# Patient Record
Sex: Male | Born: 1945
Health system: Southern US, Community
[De-identification: ages and names within clinical notes are randomized; demographics above are authoritative.]

## PROBLEM LIST (undated history)

## (undated) DIAGNOSIS — I723 Aneurysm of iliac artery: Secondary | ICD-10-CM

## (undated) DIAGNOSIS — M1990 Unspecified osteoarthritis, unspecified site: Secondary | ICD-10-CM

## (undated) DIAGNOSIS — I10 Essential (primary) hypertension: Secondary | ICD-10-CM

## (undated) DIAGNOSIS — I739 Peripheral vascular disease, unspecified: Secondary | ICD-10-CM

## (undated) HISTORY — PX: ANGIOPLASTY: SHX39

## (undated) HISTORY — DX: Aneurysm of iliac artery: I72.3

## (undated) HISTORY — DX: Unspecified osteoarthritis, unspecified site: M19.90

## (undated) HISTORY — DX: Essential (primary) hypertension: I10

## (undated) NOTE — *Deleted (*Deleted)
Patient denies shortness of breath, fever, cough or chest pain.  PCP - Dr Chanetta Marshall Cardiologist - *** Endocrine - Dr Talmage Nap  Chest x-ray - 07/22/20 EKG - 07/22/20 Stress Test - n/a ECHO - n/a Cardiac Cath - n/a  Fasting Blood Sugar - *** Checks Blood Sugar *** times a day  ERAS: Clears til 9:30 am DOS, G2 drink given.  Anesthesia review: ***  Coronavirus Screening Covid test scheduled on 07/25/20 Do you have any of the following symptoms:  Cough yes/no: No Fever (>100.58F)  yes/no: No Runny nose yes/no: No Sore throat yes/no: No Difficulty breathing/shortness of breath  yes/no: No  Have you traveled in the last 14 days and where? yes/no: No  Patient verbalized understanding of instructions that were given to them at the PAT appointment.

---

## 2001-01-03 ENCOUNTER — Ambulatory Visit (HOSPITAL_COMMUNITY): Admission: RE | Admit: 2001-01-03 | Discharge: 2001-01-03 | Payer: Self-pay | Admitting: Family Medicine

## 2001-02-21 ENCOUNTER — Encounter: Payer: Self-pay | Admitting: Vascular Surgery

## 2001-02-23 ENCOUNTER — Ambulatory Visit (HOSPITAL_COMMUNITY): Admission: RE | Admit: 2001-02-23 | Discharge: 2001-02-23 | Payer: Self-pay | Admitting: Vascular Surgery

## 2001-02-23 DIAGNOSIS — I723 Aneurysm of iliac artery: Secondary | ICD-10-CM

## 2001-02-23 HISTORY — DX: Aneurysm of iliac artery: I72.3

## 2004-03-06 ENCOUNTER — Ambulatory Visit (HOSPITAL_COMMUNITY): Admission: RE | Admit: 2004-03-06 | Discharge: 2004-03-06 | Payer: Self-pay | Admitting: Vascular Surgery

## 2007-02-02 ENCOUNTER — Emergency Department (HOSPITAL_COMMUNITY): Admission: EM | Admit: 2007-02-02 | Discharge: 2007-02-02 | Payer: Self-pay | Admitting: *Deleted

## 2008-02-14 ENCOUNTER — Ambulatory Visit: Payer: Self-pay | Admitting: Vascular Surgery

## 2008-06-28 ENCOUNTER — Ambulatory Visit: Payer: Self-pay | Admitting: Vascular Surgery

## 2009-02-11 ENCOUNTER — Ambulatory Visit: Payer: Self-pay | Admitting: Vascular Surgery

## 2009-07-02 ENCOUNTER — Ambulatory Visit: Payer: Self-pay | Admitting: Vascular Surgery

## 2010-07-22 ENCOUNTER — Ambulatory Visit: Payer: Self-pay | Admitting: Vascular Surgery

## 2011-02-17 NOTE — Consult Note (Signed)
NEW PATIENT CONSULTATION   Peter Walker, DAUGHTRIDGE  DOB:  04/30/1946                                       02/11/2009  BJYNW#:[id number]   The patient is a patient we had previously seen for left lower extremity  arterial insufficiency.  He had PTA and stenting by me in 2002 and again  in 2005 with good results.  Three weeks ago he developed some mild edema  in both lower extremities and is concerned about this.  He has been  watching this wax and wane.  He states that it is much better now and he  is not having significant swelling.  He has no history of deep vein  thrombosis, thrombophlebitis, pulmonary embolism, varicose veins, or  other venous problems.  Denies any claudication symptoms in the hip at  this time.  States he is able to ambulate long distances.  He has  stopped smoking 5 years ago and is currently taking aspirin 81 mg per  day.  He has not worn elastic compression stockings, has not seen the  need to do this in the past.   PAST MEDICAL HISTORY:  Positive for diabetes and hypertension.  Negative  for coronary artery disease, myocardial infarction, angina pectoris,  CVA, TIA, amaurosis fugax.   PHYSICAL EXAM:  Blood pressure today is 157/79, heart rate is 59,  respirations 18.  Generally, he is a healthy-appearing middle-aged male  in no apparent distress, alert and oriented x3.  Neck is supple, 3+  carotid pulses palpable.  No bruits are audible.  Chest is clear to  auscultation.  Cardiovascular exam is a regular rhythm with no murmurs.  Abdomen is soft and nontender with no masses.  He has 3+ femoral and  popliteal pulses bilaterally with well-perfused lower extremities.  There is minimal edema in the lower extremities today with no evidence  of any varicosities, hyperpigmentation, ulceration, or other venous  insufficiency stigmata.   Venous duplex exam reveals no evidence of deep venous obstruction in  either lower extremity with no valvular  incompetence in the superficial  saphenous systems.  Arterial flow is triphasic.  He was reassured  regarding these findings.  I think any edema would be due to fluid  overload or sodium imbalance, and he will check with his medical doctor,  Dr. Dorothe Pea, if he has any further problems.   Quita Skye Hart [place], M.D.  Electronically Signed   JDL/MEDQ  D:  02/11/2009  T:  02/11/2009  Job:  [id number]   cc:   Jethro Bastos, M.D.

## 2011-02-17 NOTE — Procedures (Signed)
DUPLEX DEEP VENOUS EXAM - LOWER EXTREMITY   INDICATION:  Bilateral ankle swelling.   HISTORY:  Edema:  Ankle swelling.  Trauma/Surgery:  Left CIA PTA/stent.  Pain:  No.  PE:  No.  Previous DVT:  No.  Anticoagulants:  Aspirin.  Other:   DUPLEX EXAM:                CFV   SFV   PopV  PTV    GSV                R  L  R  L  R  L  R   L  R  L  Thrombosis    o  o  o  o  o  o  o   o  o  o  Spontaneous   +  +  +  +  +  +  +   +  +  +  Phasic        +  +  +  +  +  +  +   +  +  +  Augmentation  +  +  +  +  +  +  +   +  +  +  Compressible  +  +  +  +  +  +  +   +  +  +  Competent     +  +  +  +  +  +  +   +  +  +   Legend:  + - yes  o - no  p - partial  D - decreased   IMPRESSION:  1. No evidence of DVT or SVT in bilateral lower extremity venous      system.  2. No evidence of venous insufficiency bilaterally.  3. Incidental note:  Bilateral posterior tibial artery waveforms      appear within normal limits.    _____________________________  Quita Skye Hart [place], M.D.   AS/MEDQ  D:  02/11/2009  T:  02/11/2009  Job:  [id number]

## 2011-02-17 NOTE — Assessment & Plan Note (Signed)
OFFICE VISIT   ARSHAN, JABS  DOB:  [birth year]                                       07/22/2010  DVVOH#:[id number]   Patient returns today for continued follow-up regarding his lower  extremity occlusive disease.  He was treated with PTA and stenting of  his left iliac artery in 2002 and again in 2005.  He has no classic  claudication symptoms at this time but has some sciatica in the right  hip area which is being evaluated by Dr. Richardson Landry.  He had an MRI  performed 3 weeks ago, results of which are unknown at this time.  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  He denies any rest pain or history of  nonhealing ulcers.   CHRONIC MEDICAL PROBLEMS:  1. Diabetes.  2. Hypertension.  3. Degenerative joint disease.   SOCIAL HISTORY:  He is married, has 3 children.  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  Does not use tobacco or alcohol.   REVIEW OF SYSTEMS:  Positive for arthritis, joint pain, muscle pain.  He  denies any chest pain, dyspnea on exertion, PND, orthopnea, chronic  bronchitis.  All other systems negative in review of systems.   PHYSICAL EXAMINATION:  Blood pressure 153/80, heart rate 71,  respirations 14.  General:  He is a well-developed and well-nourished  male in no apparent distress, alert and oriented x3.  HEENT:  Normal for  age.  EOMs intact.  Lungs:  Clear to auscultation.  No rhonchi or  wheezing.  Cardiovascular:  Regular rhythm.  No murmurs.  Carotid pulses  are 3+.  No bruits.  Abdomen:  Soft, nontender with no masses.  Musculoskeletal:  Free of major deformities.  Neurologic:  Normal.  Skin:  Free of rashes.  Lower extremity exam reveals 2-3+ femoral,  popliteal, and dorsalis pedis pulses bilaterally with well-perfused  lower extremities.   Today I ordered lower extremity arterial Dopplers which revealed ABI of  0.96 on the right and 1.22 on the left,  biphasic flow.   I have reassured him regarding his stents.  Will continue to follow him  on a regular basis unless his symptoms change.   He will follow up Dr. Eulah Pont for his back problems.     Quita Skye Hart [place], M.D.  Electronically Signed   JDL/MEDQ  D:  07/22/2010  T:  07/23/2010  Job:  [id number]

## 2011-02-20 NOTE — Op Note (Signed)
[facility]  Patient:    Peter Walker, Peter Walker                        MRN: [id number] Proc. Date: 02/23/01 Adm. Date:  [id number] Attending:  Colvin Caroli CC:         Peripheral Endovascular Lab, [facility]                           Operative Report  PREOPERATIVE DIAGNOSIS:  Aortoiliac occlusive disease with severe claudication, left leg.  PROCEDURES: 1. Abdominal aortogram with bilateral lower extremity runoff with the left    common femoral approach. 2. Percutaneous transluminal angioplasty and stenting of left common iliac    stenosis using 7 x 3 Powerflex catheter and a Palmaz P294 stent, plus    angioplasty using a Powerflex 8 x 2 PTA catheter.  SURGEON:  Quita Skye. Hart [place], M.D.  ANESTHESIA:  Local Xylocaine, Versed 2 mg intravenously.  DESCRIPTION OF PROCEDURE:  The patient was taken to the [facility] Peripheral Endovascular Lab and placed in the supine position, at which time both groins were prepped with Betadine solution and draped in a routine sterile manner. After infiltration with 1% Xylocaine, the left common femoral artery was entered percutaneously and a guidewire passed into the proximal iliac artery, where it met obstruction at the common iliac level.  A 5 French sheath and dilator were passed over the guidewire, the guidewire exchanged for a glidewire, which then traversed the stenosis and was passed into the proximal aorta.  A standard pigtail catheter was positioned in the suprarenal aorta.  A flush abdominal aortogram was performed, injecting 20 cc of contrast at 20 cc per second.  This revealed the aorta to be widely patent proximally with single renal arteries bilaterally, which were widely patent, and a patent inferior mesenteric artery.  There was severe atherosclerosis in the terminal aorta beginning distal to the inferior mesenteric with some mild filling defects secondary to atherosclerosis in the aorta.  The  right common iliac artery was relatively smooth and widely patent, as was the right internal and external iliac artery.  The left common iliac artery had a plaque throughout, causing moderate narrowing, with a severe stenosis at the very end of the common iliac artery approximating 95%, with also a severe stenosis at the origin of the internal iliac artery.  The catheter was withdrawn into the terminal aorta and a second injection performed with examination of the right lower extremity runoff.  The catheter was almost totally obstructive in the left common iliac artery because of the severity of the stenosis.  The right common, internal, and external iliac arteries were widely patent, and the common femoral artery, profunda femoris artery, and superficial femoral artery were widely patent with some very mild atherosclerotic changes in the superficial femoral artery proximally.  The popliteal artery was patent across the knee joint, and there was three-vessel runoff with the posterior tibial appearing to be the best vessel, with some disease in the anterior tibial proximally, with patency of the peroneal artery.  Following this, iliac angiography was performed and the lesion in the common iliac artery better delineated using the RAO projection.  The patient was given 3000 units of heparin intravenously, and a 6 French sheath was exchanged over the guidewire and positioned appropriately.  A Palmaz P294 stent was positioned over a 7 x 3 Powerflex angioplasty catheter and  this positioned over the lesion in the distal common iliac artery, and dilatation performed at 9 atmospheres for 45 seconds.  The catheter was carefully removed and angiogram performed through the sheath, which revealed significant improvement of the stenosis with slight undersizing.  Therefore, an 8 x 2 mm Powerflex catheter was positioned within the stent and a second inflation at 8 atmospheres for 30 seconds was performed,  and a completion angiogram revealed a very mild flaring of the distal end of the stent, and therefore an 8 x 2 PTA catheter was positioned across the distal end of the stent and a third inflation performed at 7 atmospheres for 30 seconds.  A final injection revealed this to be very smooth at the angioplasty site with some tapering of the proximal common iliac artery into the diseased aorta.  Left lower extremity angiogram was then performed through the sheath, which revealed the superficial femoral, common femoral, profunda femoris, popliteal arteries on the left to be widely patent with three-vessel runoff.  Having tolerated the procedure well, the sheath was removed after the heparin had been reversed, adequate compression applied.  No complications ensued.  FINDINGS: 1. Severe distal aortoiliac occlusive disease with 95% distal common iliac    stenosis on the left and widely patent right iliac system. 2. Single widely patent renal arteries bilaterally. 3. Patent inferior mesenteric artery and patent internal iliac artery on the    right. 4. Severe stenosis at the origin of the left internal iliac artery. 5. Patent superficial femoral and popliteal arteries bilaterally with two-    vessel runoff on the right via posterior tibial and peroneal artery, and    three-vessel runoff on the left.  PROCEDURE PERFORMED:  Percutaneous transluminal angioplasty and stenting of left common iliac stenosis using a 7 x 3 Powerflex and an 8 x 2 Powerflex angioplasty catheter and a P294 stent. DD:  02/23/01 TD:  02/23/01 Job: [id number]

## 2011-02-20 NOTE — Op Note (Signed)
NAME:  Peter Walker, Peter Walker                           ACCOUNT NO.:  [id number]   MEDICAL RECORD NO.:  [id number]                   PATIENT TYPE:  OIB   LOCATION:  2899                                 FACILITY:  [facility]   PHYSICIAN:  Quita Skye. Hart [place], M.D.               DATE OF BIRTH:  1946-06-19   DATE OF PROCEDURE:  03/06/2004  DATE OF DISCHARGE:                                 OPERATIVE REPORT   PREOPERATIVE DIAGNOSIS:  Severe claudication left leg secondary to iliac  occlusive disease.   POSTOPERATIVE DIAGNOSIS:  Severe claudication left leg secondary to iliac  occlusive disease.   PROCEDURE:  1. Abdominal aortogram via a left common femoral approach.  2. PTA and stenting of left common proximal common iliac artery stenosis     using an 8 mm x 18 mm Genesis on Opta at 10 atmospheres for 45 seconds.  3. PTA of left distal iliac artery, (In-stent), stenosis with 8 x 3 TTA     catheter at 10 atmospheres for 45 seconds.  4. Second PTA of left distal common iliac in-stent stenosis with 9 x 2     Powerflex catheter at 12 atmospheres for 30 seconds with completion     angiogram.   SURGEON:  Quita Skye. Hart [place], M.D.   ANESTHESIA:  Local Xylocaine and Versed 2 mg intravenously, heparin 3000  units.   CONTRAST:  135 cc.   COMPLICATIONS:  None.   DESCRIPTION OF PROCEDURE:  The patient was taken to the [facility]  Peripheral Endovascular Laboratory, placed in the supine position at which  time, both groins were prepped with Betadine scrub and solution and draped  in a routine sterile manner.  After infiltration with 1% Xylocaine, the left  common femoral artery was entered percutaneously.  A guide wire was passed  into the suprarenal aorta under fluoroscopic guidance.  A 5 French sheath  and dilator were passed over the guide wire, the dilator removed and a  standard pigtail catheter positioned in the suprarenal aorta.  Flush  abdominal aortogram was performed.  The aorta was widely patent  proximally  with single renal arteries bilaterally which were widely patent.  The  inferior mesenteric artery was also patent.  There were moderate  atherosclerotic changes in the mid to distal aorta with no areas of  significant stenosis but multiple areas of atherosclerotic changes.  The  left common iliac artery had an oblique plaque 1 cm distal to its origin  causing an apparent 75-80% stenosis.  There was an otherwise patent common  iliac artery down to the previously placed stent in the distal left common  iliac artery which had some irregularity within the stent and a significant  60-75% stenosis in the distal portion of the stent.  The left internal iliac  artery was occluded with widely patent left external iliac artery.  The  right common iliac artery had atherosclerotic changes  but had no areas of  significant stenosis.  There was a mild 20% proximal stenosis with post  stenotic dilatation.  The right internal iliac artery had an approximate 70%  proximal stenosis and the right external iliac artery was widely patent.  It  was decided to proceed with PTA and stenting of the proximal left common  iliac stenosis and the in-stent stenosis distally.  Therefore, the 5 sheath  was removed from a bright tip long 7 sheath.  3000 units of heparin was  given intravenously.  Using a Glow-N-Tell marker, the lesions were carefully  isolated.  The proximal left common iliac artery stenosis was dilated using  a Genesis on Opti system with an 8 mm x 18 mm stent on an 8 x 3 balloon and  this was dilated at 10 atmospheres for 45 seconds.  Following this, the same  angioplastic catheter was used to dilate the in-stent stenosis in the left  distal common iliac artery and this was done at 10 atmospheres for 45  seconds, both proximally and distally.  Completion angiogram was then  performed using a pigtail catheter which revealed a widely patent proximal  stent with no stenosis and some mild residual  narrowing in the distal  previously placed stent.  Therefore, a second angioplasty of the in-stent  stenosis was performed using a 9 x 2 mm Powerflex catheter at 12 atmospheres  for 30 seconds and a following angiogram revealed essential elimination of  the in-stent stenosis and widely patent vessels.  Having tolerated the  procedure well, the sheath were removed. Adequate compression applied.  No  complications ensued.   FINDINGS:  1. Proximal left common iliac stenosis approximately 80%.  2. Recurrent in-stent stenosis of left distal common iliac artery     approximating 60-75%.  3. Mild diffuse right common iliac occlusive disease.  4. 70% proximal right internal iliac artery stenosis with occlusion of left     internal iliac artery.  5. Moderate atherosclerotic changes in distal abdominal aorta.  6. Successful PTA and stenting of proximal left common iliac artery stenosis     and distal left common iliac in-stent stenosis.                                               Quita Skye Hart Rochester, M.D.    JDL/MEDQ  D:  03/06/2004  T:  03/06/2004  Job:  [id number]

## 2011-07-20 ENCOUNTER — Ambulatory Visit (INDEPENDENT_AMBULATORY_CARE_PROVIDER_SITE_OTHER): Payer: BC Managed Care – PPO | Admitting: *Deleted

## 2011-07-20 DIAGNOSIS — Z48812 Encounter for surgical aftercare following surgery on the circulatory system: Secondary | ICD-10-CM

## 2011-07-20 DIAGNOSIS — I739 Peripheral vascular disease, unspecified: Secondary | ICD-10-CM

## 2011-07-28 ENCOUNTER — Encounter: Payer: Self-pay | Admitting: Vascular Surgery

## 2011-07-29 ENCOUNTER — Other Ambulatory Visit: Payer: Self-pay

## 2011-07-29 DIAGNOSIS — I739 Peripheral vascular disease, unspecified: Secondary | ICD-10-CM

## 2011-07-29 DIAGNOSIS — Z48812 Encounter for surgical aftercare following surgery on the circulatory system: Secondary | ICD-10-CM

## 2011-10-19 ENCOUNTER — Other Ambulatory Visit: Payer: Self-pay | Admitting: Family Medicine

## 2011-10-19 ENCOUNTER — Ambulatory Visit
Admission: RE | Admit: 2011-10-19 | Discharge: 2011-10-19 | Disposition: A | Discharge status: Home / Self Care | Payer: Medicare Other | Source: Ambulatory Visit | Attending: Family Medicine | Admitting: Family Medicine

## 2011-10-19 DIAGNOSIS — M1990 Unspecified osteoarthritis, unspecified site: Secondary | ICD-10-CM

## 2012-01-14 ENCOUNTER — Encounter: Payer: Self-pay | Admitting: Vascular Surgery

## 2012-01-25 ENCOUNTER — Encounter: Payer: Self-pay | Admitting: Vascular Surgery

## 2012-01-26 ENCOUNTER — Ambulatory Visit (INDEPENDENT_AMBULATORY_CARE_PROVIDER_SITE_OTHER): Payer: Medicare Other | Admitting: Vascular Surgery

## 2012-01-26 ENCOUNTER — Encounter (INDEPENDENT_AMBULATORY_CARE_PROVIDER_SITE_OTHER): Payer: Medicare Other | Admitting: *Deleted

## 2012-01-26 ENCOUNTER — Encounter: Payer: Self-pay | Admitting: Vascular Surgery

## 2012-01-26 VITALS — BP 166/70 | HR 64 | Resp 20 | Ht 71.0 in | Wt 200.0 lb

## 2012-01-26 DIAGNOSIS — I739 Peripheral vascular disease, unspecified: Secondary | ICD-10-CM

## 2012-01-26 DIAGNOSIS — Z48812 Encounter for surgical aftercare following surgery on the circulatory system: Secondary | ICD-10-CM

## 2012-01-26 DIAGNOSIS — I70219 Atherosclerosis of native arteries of extremities with intermittent claudication, unspecified extremity: Secondary | ICD-10-CM | POA: Insufficient documentation

## 2012-01-26 NOTE — Progress Notes (Signed)
Subjective:     Patient ID: Peter Walker, male   DOB: 1946/06/01, 66 y.o.   MRN: [id number]  HPI this 66 year old male has a history of bilateral iliac PTA and stents in 2002 with repeat PTA in 2005. Since that time he has been relatively stable in his symptomatology. He does have a history of a bulging disc which has caused symptoms on the right leg. This has responded to injections in the past and he is cared for by Dr. Richardson Landry. Recently there has been a slight decrease in the ABI in the right leg and he denies any new symptoms of claudication and shorter distance from right buttock and thigh symptoms. He has no rest pain or history of nonhealing ulcers.  Past Medical History  Diagnosis Date  . Diabetes mellitus   . Hypertension   . Degenerative joint disease   . 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 Smoking status: Former Smoker -- 2.0 packs/day for 37 years    Types: Cigarettes    Quit date: 04/15/2004  . Smokeless tobacco: Never Used  . Alcohol Use: No    Family History  Problem Relation Age of Onset  . Diabetes Mother   . Heart disease Mother   . Diabetes Father   . Hyperlipidemia Father   . Hypertension Father   . Heart disease Father     No Known Allergies  Current outpatient prescriptions:amLODipine (NORVASC) 5 MG tablet, Take 5 mg by mouth daily., Disp: , Rfl: ;  atorvastatin (LIPITOR) 20 MG tablet, Take 20 mg by mouth daily., Disp: , Rfl: ;  glucosamine-chondroitin 500-400 MG tablet, Take 1 tablet by mouth 3 (three) times daily., Disp: , Rfl:  insulin aspart protamine-insulin aspart (NOVOLOG MIX 70/30) (70-30) 100 UNIT/ML injection, Inject 35 Units into the skin 2 (two) times daily with a meal., Disp: , Rfl: ;  metFORMIN (GLUCOPHAGE) 500 MG tablet, Take 500 mg by mouth 2 (two) times daily with a meal., Disp: , Rfl: ;  naproxen (NAPROSYN)  500 MG tablet, Take 500 mg by mouth 2 (two) times daily with a meal., Disp: , Rfl:  telmisartan (MICARDIS) 40 MG tablet, Take 40 mg by mouth daily., Disp: , Rfl: ;  aspirin 81 MG tablet, Take 81 mg by mouth daily., Disp: , Rfl:   BP 166/70  Pulse 64  Resp 20  Ht 5\' 11"  (1.803 m)  Wt 200 lb (90.719 kg)  BMI 27.89 kg/m2  Body mass index is 27.89 kg/(m^2).          Review of Systems denies chest pain, dyspnea on exertion, PND, orthopnea. Does have chronic back pain and right lower extremity discomfort due to bulging disc     Objective:   Physical Exam blood pressure 166/70 heart rate 64 respirations 20 General well-developed well-nourished male no apparent stress alert and oriented x3 Chest no rhonchi or wheezing Cardiovascular regular rhythm no murmurs carotid pulses were plus all bruits Abdomen soft nontender no palpable masses Lower extremity 3+ femoral 1-2+ popliteal pulses bilaterally with well-perfused lower extremities  Today I reviewed lower extremity arterial Dopplers which were reordered and interpreted. ABI is 0.80 on the right down from 0.82 in October of 2012 in the left continues to be 1.0. There is triphasic flow and posterior tibial arteries bilaterally.     Assessment:  Suspect mild re\re narrowing within the right iliac stent causing slight decrease in ABI on the right-0.80-asymptomatic    Plan:     We'll continue to follow this on her every 6 month basis. If his symptoms become significantly worse with short walking distances he will be in touch with [place] for repeat angiography

## 2012-07-27 ENCOUNTER — Ambulatory Visit (INDEPENDENT_AMBULATORY_CARE_PROVIDER_SITE_OTHER): Payer: Medicare Other | Admitting: Vascular Surgery

## 2012-07-27 ENCOUNTER — Encounter: Payer: Self-pay | Admitting: Neurosurgery

## 2012-07-27 ENCOUNTER — Ambulatory Visit (INDEPENDENT_AMBULATORY_CARE_PROVIDER_SITE_OTHER): Payer: Medicare Other | Admitting: Neurosurgery

## 2012-07-27 VITALS — BP 148/79 | HR 59 | Resp 16 | Ht 71.0 in | Wt 211.3 lb

## 2012-07-27 DIAGNOSIS — I739 Peripheral vascular disease, unspecified: Secondary | ICD-10-CM

## 2012-07-27 DIAGNOSIS — Z48812 Encounter for surgical aftercare following surgery on the circulatory system: Secondary | ICD-10-CM

## 2012-07-27 NOTE — Progress Notes (Signed)
[facility] Office Note  CC: PD surveillance Referring Physician: Hart Rochester  History of Present Illness: 66 year old male patient of Dr. Hart Rochester who status post left common iliac artery stent 2005. The patient denies claudication, rest pain or any open ulcerations. 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 The patient's only complaint is arthritis in his hands which has been evaluated but not treated.  Past Medical History  Diagnosis Date  . Diabetes mellitus   . Hypertension   . Degenerative joint disease   . Aneurysm, common iliac artery 02/23/2001    Left  and Severe claudication Left leg  secondary to  iliac occlusive  disease 03/06/2004    ROS: [x]  Positive   [ ]  Denies    General: [ ]  Weight loss, [ ]  Fever, [ ]  chills Neurologic: [ ]  Dizziness, [ ]  Blackouts, [ ]  Seizure [ ]  Stroke, [ ]  "Mini stroke", [ ]  Slurred speech, [ ]  Temporary blindness; [ ]  weakness in arms or legs, [ ]  Hoarseness Cardiac: [ ]  Chest pain/pressure, [ ]  Shortness of breath at rest [ ]  Shortness of breath with exertion, [ ]  Atrial fibrillation or irregular heartbeat Vascular: [ ]  Pain in legs with walking, [ ]  Pain in legs at rest, [ ]  Pain in legs at night,  [ ]  Non-healing ulcer, [ ]  Blood clot in vein/DVT,   Pulmonary: [ ]  Home oxygen, [ ]  Productive cough, [ ]  Coughing up blood, [ ]  Asthma,  [ ]  Wheezing Musculoskeletal:  [ ]  Arthritis, [ ]  Low back pain, [ ]  Joint pain Hematologic: [ ]  Easy Bruising, [ ]  Anemia; [ ]  Hepatitis Gastrointestinal: [ ]  Blood in stool, [ ]  Gastroesophageal Reflux/heartburn, [ ]  Trouble swallowing Urinary: [ ]  chronic Kidney disease, [ ]  on HD - [ ]  MWF or [ ]  TTHS, [ ]  Burning with urination, [ ]  Difficulty urinating Skin: [ ]  Rashes, [ ]  Wounds Psychological: [ ]  Anxiety, [ ]  Depression   Social History History  Substance Use Topics  . Smoking status: Former  Smoker -- 2.0 packs/day for 37 years    Types: Cigarettes    Quit date: 04/15/2004  . Smokeless tobacco: Never Used  . Alcohol Use: No    Family History Family History  Problem Relation Age of Onset  . Diabetes Mother   . Heart disease Mother   . Diabetes Father   . Hyperlipidemia Father   . Hypertension Father   . Heart disease Father     No Known Allergies  Current Outpatient Prescriptions  Medication Sig Dispense Refill  . amLODipine (NORVASC) 5 MG tablet Take 5 mg by mouth daily.      Marland Kitchen aspirin 81 MG tablet Take 81 mg by mouth daily.      Marland Kitchen atorvastatin (LIPITOR) 20 MG tablet Take 20 mg by mouth daily.      Marland Kitchen glucosamine-chondroitin 500-400 MG tablet Take 1 tablet by mouth 3 (three) times daily.      . insulin aspart protamine-insulin aspart (NOVOLOG MIX 70/30) (70-30) 100 UNIT/ML injection Inject 35 Units into the skin 2 (two) times daily with a meal.      . metFORMIN (GLUCOPHAGE) 500 MG tablet Take 500 mg by mouth 2 (two) times daily with a meal.      . naproxen (NAPROSYN) 500 MG tablet Take 500 mg by mouth 2 (two) times daily with a meal.      .  telmisartan (MICARDIS) 40 MG tablet Take 40 mg by mouth daily.        Physical Examination  There were no vitals filed for this visit.  There is no height or weight on file to calculate BMI.  General:  WDWN in NAD Gait: Normal HEENT: WNL Eyes: Pupils equal Pulmonary: normal non-labored breathing , without Rales, rhonchi,  wheezing Cardiac: RRR, without  Murmurs, rubs or gallops; No carotid bruits Abdomen: soft, NT, no masses Skin: no rashes, ulcers noted Vascular Exam/Pulses: Palpable PT and DP pulses bilaterally  Extremities without ischemic changes, no Gangrene , no cellulitis; no open wounds;  Musculoskeletal: no muscle wasting or atrophy  Neurologic: A&O X 3; Appropriate Affect ; SENSATION: normal; MOTOR FUNCTION:  moving all extremities equally. Speech is fluent/normal  Non-Invasive Vascular Imaging: ABIs today  were 0.94 and triphasic to biphasic on the right, 1.07 and triphasic on the left which is improved from previous study in April 2013 with a patent left ICA stent, there are some elevated velocities involving the right proximal common iliac artery  ASSESSMENT/PLAN: This is a patient with no claudication but does have occasional right sided lower extremity pain due to herniated disc. The patient will followup in 6 months for repeat ABIs and aortoiliac scan. The patient's questions were encouraged and answered, he is in agreement with this plan. I did refer the patient to Dr. Amanda Pea at [facility] for evaluation of his hands per his request.  Lauree Chandler ANP  Clinic M.D.: Edilia Bo

## 2012-07-28 NOTE — Addendum Note (Signed)
Addended by: Sharee Pimple on: 07/28/2012 02:42 PM   Modules accepted: Orders

## 2012-08-02 ENCOUNTER — Ambulatory Visit: Payer: Medicare Other | Admitting: Neurosurgery

## 2013-01-09 ENCOUNTER — Encounter (INDEPENDENT_AMBULATORY_CARE_PROVIDER_SITE_OTHER): Payer: Medicare Other | Admitting: *Deleted

## 2013-01-09 DIAGNOSIS — Z48812 Encounter for surgical aftercare following surgery on the circulatory system: Secondary | ICD-10-CM

## 2013-01-09 DIAGNOSIS — I739 Peripheral vascular disease, unspecified: Secondary | ICD-10-CM

## 2013-01-11 ENCOUNTER — Other Ambulatory Visit: Payer: Self-pay

## 2013-01-11 DIAGNOSIS — Z48812 Encounter for surgical aftercare following surgery on the circulatory system: Secondary | ICD-10-CM

## 2013-01-11 DIAGNOSIS — I739 Peripheral vascular disease, unspecified: Secondary | ICD-10-CM

## 2013-01-16 ENCOUNTER — Encounter: Payer: Self-pay | Admitting: Vascular Surgery

## 2013-01-24 ENCOUNTER — Ambulatory Visit: Payer: Medicare Other | Admitting: Neurosurgery

## 2013-07-06 ENCOUNTER — Other Ambulatory Visit: Payer: Self-pay | Admitting: *Deleted

## 2013-07-06 DIAGNOSIS — Z48812 Encounter for surgical aftercare following surgery on the circulatory system: Secondary | ICD-10-CM

## 2013-07-06 DIAGNOSIS — I739 Peripheral vascular disease, unspecified: Secondary | ICD-10-CM

## 2013-07-17 ENCOUNTER — Encounter: Payer: Self-pay | Admitting: Vascular Surgery

## 2013-07-18 ENCOUNTER — Encounter (HOSPITAL_COMMUNITY): Payer: Medicare Other

## 2013-07-18 ENCOUNTER — Ambulatory Visit: Payer: Medicare Other | Admitting: Vascular Surgery

## 2013-07-18 ENCOUNTER — Other Ambulatory Visit (HOSPITAL_COMMUNITY): Payer: Medicare Other

## 2013-10-02 ENCOUNTER — Encounter: Payer: Self-pay | Admitting: Vascular Surgery

## 2013-10-03 ENCOUNTER — Inpatient Hospital Stay (HOSPITAL_COMMUNITY): Admission: RE | Admit: 2013-10-03 | Payer: Medicare Other | Source: Ambulatory Visit

## 2013-10-03 ENCOUNTER — Ambulatory Visit: Payer: Medicare Other | Admitting: Vascular Surgery

## 2014-11-20 ENCOUNTER — Other Ambulatory Visit: Payer: Self-pay | Admitting: *Deleted

## 2014-11-20 DIAGNOSIS — I739 Peripheral vascular disease, unspecified: Secondary | ICD-10-CM

## 2014-11-20 DIAGNOSIS — Z48812 Encounter for surgical aftercare following surgery on the circulatory system: Secondary | ICD-10-CM

## 2014-11-26 ENCOUNTER — Encounter: Payer: Self-pay | Admitting: Vascular Surgery

## 2014-11-27 ENCOUNTER — Encounter: Payer: Self-pay | Admitting: Vascular Surgery

## 2014-11-27 ENCOUNTER — Ambulatory Visit (INDEPENDENT_AMBULATORY_CARE_PROVIDER_SITE_OTHER)
Admission: RE | Admit: 2014-11-27 | Discharge: 2014-11-27 | Disposition: A | Discharge status: Home / Self Care | Payer: Medicare HMO | Source: Ambulatory Visit | Attending: Vascular Surgery | Admitting: Vascular Surgery

## 2014-11-27 ENCOUNTER — Ambulatory Visit (HOSPITAL_COMMUNITY)
Admission: RE | Admit: 2014-11-27 | Discharge: 2014-11-27 | Disposition: A | Discharge status: Home / Self Care | Payer: Medicare HMO | Source: Ambulatory Visit | Attending: Vascular Surgery | Admitting: Vascular Surgery

## 2014-11-27 ENCOUNTER — Ambulatory Visit (INDEPENDENT_AMBULATORY_CARE_PROVIDER_SITE_OTHER): Payer: Medicare HMO | Admitting: Vascular Surgery

## 2014-11-27 VITALS — BP 162/83 | HR 67 | Resp 18 | Ht 71.0 in | Wt 214.0 lb

## 2014-11-27 DIAGNOSIS — Z48812 Encounter for surgical aftercare following surgery on the circulatory system: Secondary | ICD-10-CM

## 2014-11-27 DIAGNOSIS — I739 Peripheral vascular disease, unspecified: Secondary | ICD-10-CM

## 2014-11-27 NOTE — Progress Notes (Signed)
Subjective:     Patient ID: Peter Walker, male   DOB: 09/22/1946, 69 y.o.   MRN: [id number]  HPI this 70 year old male who has a history of bilateral iliac stents in 2003 in 2005 returns today for further evaluation. He also has degenerative disc disease with a "herniated disc" and is followed by Dr. Rodell Perna. He has had epidural injections on many occasions. His biggest complaint is right hip discomfort which does occur with ambulation and is relieved by rest but is also relieved by the epidural injections. He has no symptoms on the left side. He is able to walk about 100-200 yards. He also has difficulty climbing stairs but this is almost immediate in the right hip region rather than after climbing a few stairs. He has no history of rest pain nonhealing ulcers or infection. He takes aspirin daily.  Past Medical History  Diagnosis Date  . Diabetes mellitus   . Hypertension   . Degenerative joint disease   . 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 Smoking status: Former Smoker -- 2.00 packs/day for 37 years    Types: Cigarettes    Quit date: 04/15/2004  . Smokeless tobacco: Never Used  . Alcohol Use: No    Family History  Problem Relation Age of Onset  . Diabetes Mother   . Heart disease Mother   . Diabetes Father   . Hyperlipidemia Father   . Hypertension Father   . Heart disease Father     No Known Allergies   Current outpatient prescriptions:  .  amLODipine (NORVASC) 5 MG tablet, Take 5 mg by mouth daily., Disp: , Rfl:  .  aspirin 81 MG tablet, Take 81 mg by mouth daily., Disp: , Rfl:  .  glucosamine-chondroitin 500-400 MG tablet, Take 1 tablet by mouth 3 (three) times daily., Disp: , Rfl:  .  insulin aspart protamine-insulin aspart (NOVOLOG MIX 70/30) (70-30) 100 UNIT/ML injection, Inject 35 Units into the skin 2 (two) times daily with a meal.,  Disp: , Rfl:  .  losartan (COZAAR) 25 MG tablet, Take 25 mg by mouth daily., Disp: , Rfl:  .  metFORMIN (GLUCOPHAGE) 500 MG tablet, Take 500 mg by mouth 2 (two) times daily with a meal., Disp: , Rfl:  .  Rosuvastatin Calcium (CRESTOR PO), Take by mouth., Disp: , Rfl:  .  atorvastatin (LIPITOR) 20 MG tablet, Take 20 mg by mouth daily., Disp: , Rfl:  .  naproxen (NAPROSYN) 500 MG tablet, Take 500 mg by mouth 2 (two) times daily with a meal., Disp: , Rfl:  .  telmisartan (MICARDIS) 40 MG tablet, Take 40 mg by mouth daily., Disp: , Rfl:   BP 162/83 mmHg  Pulse 67  Resp 18  Ht 5\' 11"  (1.803 m)  Wt 214 lb (97.07 kg)  BMI 29.86 kg/m2  Body mass index is 29.86 kg/(m^2).           Review of Systems denies chest pain, dyspnea on exertion, PND, orthopnea, hemoptysis,     Objective:   Physical Exam BP 162/83 mmHg  Pulse 67  Resp 18  Ht 5\' 11"  (1.803 m)  Wt 214 lb (97.07 kg)  BMI 29.86 kg/m2  Gen.-alert and oriented x3 in no apparent distress HEENT normal for age Lungs no rhonchi or wheezing Cardiovascular regular rhythm no murmurs carotid pulses 3+  palpable no bruits audible Abdomen soft nontender no palpable masses Musculoskeletal free of  major deformities Skin clear -no rashes Neurologic normal Lower extremities 3+ femoral and posterior tibial pulse palpable on the left 2+ femoral and 2+ posterior tibial pulse palpable on the right Both feet well perfused   Today I ordered duplex scan which revealed some mild increased velocity in the left iliac stent. The ABIs continued to be good bilaterally at 0.90 on the right and 1.2 on the left. There is biphasic flow in the right posterior tibial and triphasic in the left posterior tibial       Assessment:     Possible mild stenosis of right iliac stent-uncertain if this is causing any symptoms Patient has herniated disc problems and gets epidural injections with relief of right hip discomfort    Plan:     Discussed with  patient potential for angiography to see if he had some narrowing of his right iliac stent. He does not want to proceed with this at the present time. His symptoms are not really limiting him significantly. I'm not convinced that the right hip discomfort has any vascular etiology at all because of his herniated disc problem If he should decide to proceed I think angiography would be reasonable to see if he has any re-narrowing in the right iliac system around the stented area that could be treated with angioplasty at the same setting. He will return in one year for repeat ABIs unless his symptoms worsen

## 2014-11-28 NOTE — Addendum Note (Signed)
Addended by: Mena Goes on: 11/28/2014 01:54 PM   Modules accepted: Orders

## 2015-04-25 ENCOUNTER — Encounter: Payer: Self-pay | Admitting: Skilled Nursing Facility1

## 2015-04-25 ENCOUNTER — Encounter: Payer: Medicare Other | Attending: Family Medicine | Admitting: Skilled Nursing Facility1

## 2015-04-25 VITALS — Ht 71.0 in | Wt 214.0 lb

## 2015-04-25 DIAGNOSIS — E119 Type 2 diabetes mellitus without complications: Secondary | ICD-10-CM | POA: Diagnosis present

## 2015-04-25 DIAGNOSIS — Z713 Dietary counseling and surveillance: Secondary | ICD-10-CM | POA: Insufficient documentation

## 2015-04-25 DIAGNOSIS — Z794 Long term (current) use of insulin: Secondary | ICD-10-CM | POA: Insufficient documentation

## 2015-04-25 NOTE — Progress Notes (Signed)
Diabetes Self-Management Education  Visit Type: First/Initial  Appt. Start Time: 11:00Appt. End Time: 12:30  04/25/2015  Mr. Peter Walker, identified by name and date of birth, is a 69 y.o. male with a diagnosis of Diabetes: Type 2.  Other people present during visit:  Spouse/SO   ASSESSMENT  Height 5\' 11"  (1.803 m), weight 214 lb (97.07 kg). Body mass index is 29.86 kg/(m^2).  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 Pt states he had his blood sugar under control but then the anniversary of their sons death made him difficult to stay on an appropriate diet. Pt states when he gets an epidermal injection it causes his blood sugars to increase very high.   Initial Visit Information:  Are you currently following a meal plan?: No   Are you taking your medications as prescribed?: Yes Are you checking your feet?: Yes How many days per week are you checking your feet?: 7 How often do you need to have someone help you when you read instructions, pamphlets, or other written materials from your doctor or pharmacy?: 1 - Never    Psychosocial:     Patient Belief/Attitude about Diabetes: Motivated to manage diabetes Self-care barriers: None Self-management support: Family Other persons present: Spouse/SO Patient Concerns: Nutrition/Meal planning Special Needs: None Preferred Learning Style: Hands on Learning Readiness: Ready  Complications:   Last HgB A1C per patient/outside source: 11.5 mg/dL How often do you check your blood sugar?:  (4 days a week) Fasting Blood glucose range (mg/dL): 180-200, >200 Postprandial Blood glucose range (mg/dL): >200 Number of hypoglycemic episodes per month: 2 Can you tell when your blood sugar is low?: Yes What do you do if your blood sugar is low?: eat Number of hyperglycemic episodes per week: 0 Have you had a dilated eye exam in the past 12 months?: No Have you had a dental exam in the past 12 months?:  Yes  Diet Intake:  Breakfast: fast food Lunch: cookout corn dogs-----fast food Dinner: peanut butter mayo sandwhich and milk Beverage(s): coffee,   Exercise:  Exercise: ADL's  Individualized Plan for Diabetes Self-Management Training:   Learning Objective:  Patient will have a greater understanding of diabetes self-management.  Patient education plan per assessed needs and concerns is to attend individual sessions for     Education Topics Reviewed with Patient Today:  Definition of diabetes, type 1 and 2, and the diagnosis of diabetes, Factors that contribute to the development of diabetes Role of diet in the treatment of diabetes and the relationship between the three main macronutrients and blood glucose level, Food label reading, portion sizes and measuring food., Carbohydrate counting Role of exercise on diabetes management, blood pressure control and cardiac health., Identified with patient nutritional and/or medication changes necessary with exercise.       Assessed and discussed foot care and prevention of foot problems, Dental care, Retinopathy and reason for yearly dilated eye exams Identified and addressed patients feelings and concerns about diabetes      PATIENTS GOALS/Plan (Developed by the patient):  Nutrition: Follow meal plan discussed, General guidelines for healthy choices and portions discussed, Adjust meds/carbs with exercise as discussed Medications: take my medication as prescribed Monitoring : test my blood glucose as discussed (note x per day with comment) (every day) Reducing Risk: do foot checks daily  Plan:   There are no Patient Instructions on file for this visit.  Expected Outcomes:  Demonstrated interest in learning. Expect  positive outcomes  Education material provided: Living Well with Diabetes and Meal plan card  If problems or questions, patient to contact team via:  Phone  Future DSME appointment: PRN

## 2015-11-22 ENCOUNTER — Encounter: Payer: Self-pay | Admitting: Vascular Surgery

## 2015-12-03 ENCOUNTER — Other Ambulatory Visit: Payer: Self-pay | Admitting: Vascular Surgery

## 2015-12-03 ENCOUNTER — Ambulatory Visit (HOSPITAL_COMMUNITY)
Admission: RE | Admit: 2015-12-03 | Discharge: 2015-12-03 | Disposition: A | Discharge status: Home / Self Care | Payer: Medicare Other | Source: Ambulatory Visit | Attending: Vascular Surgery | Admitting: Vascular Surgery

## 2015-12-03 ENCOUNTER — Ambulatory Visit: Payer: Medicare HMO | Admitting: Vascular Surgery

## 2015-12-03 ENCOUNTER — Encounter: Payer: Self-pay | Admitting: Vascular Surgery

## 2015-12-03 ENCOUNTER — Ambulatory Visit (INDEPENDENT_AMBULATORY_CARE_PROVIDER_SITE_OTHER)
Admission: RE | Admit: 2015-12-03 | Discharge: 2015-12-03 | Disposition: A | Discharge status: Home / Self Care | Payer: Medicare Other | Source: Ambulatory Visit | Attending: Vascular Surgery | Admitting: Vascular Surgery

## 2015-12-03 DIAGNOSIS — I10 Essential (primary) hypertension: Secondary | ICD-10-CM | POA: Diagnosis not present

## 2015-12-03 DIAGNOSIS — I739 Peripheral vascular disease, unspecified: Secondary | ICD-10-CM | POA: Diagnosis not present

## 2015-12-03 DIAGNOSIS — E119 Type 2 diabetes mellitus without complications: Secondary | ICD-10-CM | POA: Insufficient documentation

## 2015-12-10 ENCOUNTER — Encounter: Payer: Self-pay | Admitting: Vascular Surgery

## 2015-12-10 ENCOUNTER — Ambulatory Visit (INDEPENDENT_AMBULATORY_CARE_PROVIDER_SITE_OTHER): Payer: Medicare Other | Admitting: Vascular Surgery

## 2015-12-10 VITALS — BP 158/79 | HR 75 | Temp 98.1°F | Resp 16 | Ht 71.0 in | Wt 217.0 lb

## 2015-12-10 DIAGNOSIS — Z48812 Encounter for surgical aftercare following surgery on the circulatory system: Secondary | ICD-10-CM | POA: Diagnosis not present

## 2015-12-10 DIAGNOSIS — I739 Peripheral vascular disease, unspecified: Secondary | ICD-10-CM

## 2015-12-10 NOTE — Progress Notes (Signed)
Filed Vitals:   12/10/15 1452 12/10/15 1456  BP: 150/69 158/79  Pulse: 79 75  Temp: 98.1 F (36.7 C)   Resp: 16   Height: 5\' 11"  (1.803 m)   Weight: 217 lb (98.431 kg)   SpO2: 99%

## 2015-12-10 NOTE — Progress Notes (Signed)
Subjective:     Patient ID: Peter Walker, male   DOB: [birth year], 70 y.o.   MRN: [id number]  HPI this 70 year old male returns for continued follow-up regarding his bilateral iliac stents placed in 2003 and 2005. He continues to remain quite active. He does develop some right hip discomfort after ambulating a few blocks. This is relieved by rest. He also has problem with a chronic herniated disc which has required multiple steroid injections in the past managed by Dr. Sheran Luz. It's unclear whether the hip discomfort is coming from the back or the possible stenosis in the right iliac stent but his symptoms have not been severe enough in the past to warrant repeat angiography. He denies any rest pain and nonhealing ulcers or infection.  Past Medical History  Diagnosis Date  . Diabetes mellitus   . Hypertension   . Degenerative joint disease   . Aneurysm, common iliac artery ([place]) 02/23/2001    Left  and Severe claudication Left leg  secondary to  iliac occlusive  disease 03/06/2004    Social History  Substance Use Topics  . Smoking status: Former Smoker -- 2.00 packs/day for 37 years    Types: Cigarettes    Quit date: 04/15/2004  . Smokeless tobacco: Never Used  . Alcohol Use: No    Family History  Problem Relation Age of Onset  . Diabetes Mother   . Heart disease Mother   . Diabetes Father   . Hyperlipidemia Father   . Hypertension Father   . Heart disease Father     No Known Allergies   Current outpatient prescriptions:  .  amLODipine (NORVASC) 5 MG tablet, Take 5 mg by mouth daily., Disp: , Rfl:  .  aspirin 81 MG tablet, Take 81 mg by mouth daily., Disp: , Rfl:  .  glucosamine-chondroitin 500-400 MG tablet, Take 1 tablet by mouth 3 (three) times daily., Disp: , Rfl:  .  insulin aspart protamine-insulin aspart (NOVOLOG MIX 70/30) (70-30) 100 UNIT/ML injection, Inject 35 Units into the skin 2 (two) times daily with a meal., Disp: , Rfl:  .  losartan (COZAAR) 25 MG tablet,  Take 25 mg by mouth daily., Disp: , Rfl:  .  Rosuvastatin Calcium (CRESTOR PO), Take by mouth., Disp: , Rfl:  .  traZODone (DESYREL) 25 mg TABS tablet, Take 25 mg by mouth at bedtime as needed for sleep., Disp: , Rfl:  .  atorvastatin (LIPITOR) 20 MG tablet, Take 20 mg by mouth daily. Reported on 12/10/2015, Disp: , Rfl:  .  metFORMIN (GLUCOPHAGE) 500 MG tablet, Take 500 mg by mouth 2 (two) times daily with a meal. Reported on 12/10/2015, Disp: , Rfl:  .  naproxen (NAPROSYN) 500 MG tablet, Take 500 mg by mouth 2 (two) times daily with a meal. Reported on 12/10/2015, Disp: , Rfl:  .  telmisartan (MICARDIS) 40 MG tablet, Take 40 mg by mouth daily. Reported on 12/10/2015, Disp: , Rfl:   Filed Vitals:   12/10/15 1452 12/10/15 1456  BP: 150/69 158/79  Pulse: 79 75  Temp: 98.1 F (36.7 C)   Resp: 16   Height: 5\' 11"  (1.803 m)   Weight: 217 lb (98.431 kg)   SpO2: 99%     Body mass index is 30.28 kg/(m^2).           Review of Systems S pain, dyspnea on exertion, PND, orthopnea, hemoptysis, lateralizing weakness, aphasia, amaurosis fugax, diplopia, blurred vision, or syncope. See history of present illness. Negative for tobacco  abuse. Other systems negative and complete review of systems     Objective:   Physical Exam BP 158/79 mmHg  Pulse 75  Temp(Src) 98.1 F (36.7 C)  Resp 16  Ht 5\' 11"  (1.803 m)  Wt 217 lb (98.431 kg)  BMI 30.28 kg/m2  SpO2 99%  Gen.-alert and oriented x3 in no apparent distress HEENT normal for age Lungs no rhonchi or wheezing Cardiovascular regular rhythm no murmurs carotid pulses 3+ palpable no bruits audible Abdomen soft nontender no palpable masses Musculoskeletal free of  major deformities Skin clear -no rashes Neurologic normal Lower extremities 3+ femoral and  Posterior tibial pulses palpable bilaterally with well-perfused feet. No evidence of ischemia.  Today I ordered bilateral ABIs which I reviewed and interpreted. There is triphasic flow in both  feet. ABIs are 0.96 on the right and 1.1 on the left.       Assessment:      remote history of bilateral iliac stents with mild right hip claudication -unclear whether this is vascular due to some stenosis within the stent versus chronic herniated disc which has required spinal steroid injections in the past These are not limiting him     Plan:       Continue to increase activity as much as tolerated Return in 1 year with ABIs and duplex scan of aortoiliac system to look for elevated velocity within right iliac stent If symptoms worsen in the interim he will be in touch with [place] and we could consider angiography and possible angioplasty f in-stent stenosis if symptoms warrant that can  return next year to see Dr.Cain

## 2015-12-10 NOTE — Addendum Note (Signed)
Addended by: Mena Goes on: 12/10/2015 04:34 PM   Modules accepted: Orders

## 2016-06-12 ENCOUNTER — Encounter: Payer: Self-pay | Admitting: Vascular Surgery

## 2016-06-19 ENCOUNTER — Ambulatory Visit (INDEPENDENT_AMBULATORY_CARE_PROVIDER_SITE_OTHER): Payer: Medicare Other | Admitting: Vascular Surgery

## 2016-06-19 ENCOUNTER — Ambulatory Visit (HOSPITAL_COMMUNITY)
Admission: RE | Admit: 2016-06-19 | Discharge: 2016-06-19 | Disposition: A | Discharge status: Home / Self Care | Payer: Medicare Other | Source: Ambulatory Visit | Attending: Vascular Surgery | Admitting: Vascular Surgery

## 2016-06-19 ENCOUNTER — Encounter: Payer: Self-pay | Admitting: Vascular Surgery

## 2016-06-19 ENCOUNTER — Ambulatory Visit (INDEPENDENT_AMBULATORY_CARE_PROVIDER_SITE_OTHER)
Admission: RE | Admit: 2016-06-19 | Discharge: 2016-06-19 | Disposition: A | Discharge status: Home / Self Care | Payer: Medicare Other | Source: Ambulatory Visit | Attending: Vascular Surgery | Admitting: Vascular Surgery

## 2016-06-19 VITALS — BP 156/72 | HR 58 | Ht 71.0 in | Wt 207.0 lb

## 2016-06-19 DIAGNOSIS — I739 Peripheral vascular disease, unspecified: Secondary | ICD-10-CM | POA: Diagnosis present

## 2016-06-19 DIAGNOSIS — Z48812 Encounter for surgical aftercare following surgery on the circulatory system: Secondary | ICD-10-CM

## 2016-06-19 NOTE — Progress Notes (Signed)
Patient ID: Peter Walker, male   DOB: 02-19-1946, 70 y.o.   MRN: [id number]  Reason for Consult: Re-evaluation (1 year f/u )   Referred by Jonathon Jordan, MD  Subjective:     HPI:  Peter Walker is a 70 y.o. male returns for follow-up of his previous bilateral iliac artery stents. He most recently saw Dr. Kellie Simmering 6 months back. His ABI at that time was 0.96 with elevated velocities within his stent which she continues to have today. He does have pain that starts in his right flank radiating down his right buttock and into the posterior aspect of his right leg. This is sharp and shooting in nature with occasional burning. He denies claudication does not have any ulceration of his right leg. He is in the process of scheduling minimally invasive back surgery for this issue. He does take aspirin and a statin and is nonsmoker but is diabetic.  Past Medical History:  Diagnosis Date  . Aneurysm, common iliac artery ([place]) 02/23/2001   Left  and Severe claudication Left leg  secondary to  iliac occlusive  disease 03/06/2004  . Degenerative joint disease   . Diabetes mellitus   . Hypertension    Family History  Problem Relation Age of Onset  . Diabetes Mother   . Heart disease Mother   . Diabetes Father   . Hyperlipidemia Father   . Hypertension Father   . Heart disease Father    Past Surgical History:  Procedure Laterality Date  . ANGIOPLASTY     2003 AND 2005    Short Social History:  Social History  Substance Use Topics  . Smoking status: Former Smoker    Packs/day: 2.00    Years: 37.00    Types: Cigarettes    Quit date: 04/15/2004  . Smokeless tobacco: Never Used  . Alcohol use No    No Known Allergies  Current Outpatient Prescriptions  Medication Sig Dispense Refill  . amLODipine (NORVASC) 5 MG tablet Take 5 mg by mouth daily.    Marland Kitchen aspirin 81 MG tablet Take 81 mg by mouth daily.    Marland Kitchen atorvastatin (LIPITOR) 20 MG tablet Take 20 mg by mouth daily. Reported on 12/10/2015      . glucosamine-chondroitin 500-400 MG tablet Take 1 tablet by mouth 3 (three) times daily.    Marland Kitchen losartan (COZAAR) 25 MG tablet Take 25 mg by mouth daily.    . Rosuvastatin Calcium (CRESTOR PO) Take by mouth.    . traZODone (DESYREL) 25 mg TABS tablet Take 25 mg by mouth at bedtime as needed for sleep.    . insulin aspart protamine-insulin aspart (NOVOLOG MIX 70/30) (70-30) 100 UNIT/ML injection Inject 35 Units into the skin 2 (two) times daily with a meal.    . metFORMIN (GLUCOPHAGE) 500 MG tablet Take 500 mg by mouth 2 (two) times daily with a meal. Reported on 12/10/2015    . naproxen (NAPROSYN) 500 MG tablet Take 500 mg by mouth 2 (two) times daily with a meal. Reported on 12/10/2015    . telmisartan (MICARDIS) 40 MG tablet Take 40 mg by mouth daily. Reported on 12/10/2015     No current facility-administered medications for this visit.     Review of Systems  Constitutional:  Constitutional negative. Eyes: Eyes negative.  Respiratory: Respiratory negative.  Cardiovascular: Cardiovascular negative. Negative for chest pain.  GI: Gastrointestinal negative.  Musculoskeletal: Positive for back pain and leg pain.  Skin: Skin negative.  Neurological: Neurological negative. Hematologic:  Hematologic/lymphatic negative.  Psychiatric: Psychiatric negative.        Objective:  Objective   Vitals:   06/19/16 1002 06/19/16 1003  BP: (!) 160/74 (!) 156/72  Pulse: (!) 58   SpO2: 98%   Weight: 207 lb (93.9 kg)   Height: 5\' 11"  (1.803 m)    Body mass index is 28.87 kg/m.  Physical Exam  Constitutional: He is oriented to person, place, and time. He appears well-developed.  HENT:  Head: Normocephalic.  Eyes: EOM are normal.  Neck: Normal range of motion.  Cardiovascular: Normal rate.   Pulses:      Femoral pulses are 1+ on the right side, and 2+ on the left side.      Popliteal pulses are 0 on the right side, and 2+ on the left side.  Musculoskeletal: Normal range of motion.   Neurological: He is alert and oriented to person, place, and time. He has normal reflexes.  Skin: Skin is warm and dry.    Data: Patent right common iliac artery stent with duplex velocities greater than 50% stenosis ABI on the right 0.69 and on the left 1.15.     Assessment/Plan:   70 year old white male returns for follow-up of his bilateral common iliac artery stenting. He does have back pain that radiates to his right lower extremity that is apparently musculoskeletal or back related. He does not have episodes of claudication and does not endorse rest pain or ulceration of his foot. We have discussed the possibility of intervention for this stent stenosis and likely right lower extremity stenoses as well. At this time given that he is likely asymptomatic from a vascular standpoint we will follow him in 6 months with repeat aortoiliac duplex and ABIs. He will continue his aspirin and statin and walking as tolerated.    Shaquila Sigman C. Donzetta Matters, MD [facility] [place] Office: [phone] Pager: [phone]

## 2016-07-21 ENCOUNTER — Ambulatory Visit (INDEPENDENT_AMBULATORY_CARE_PROVIDER_SITE_OTHER): Payer: Medicare Other | Admitting: Orthopaedic Surgery

## 2016-07-21 ENCOUNTER — Ambulatory Visit (INDEPENDENT_AMBULATORY_CARE_PROVIDER_SITE_OTHER): Payer: Self-pay | Admitting: Orthopaedic Surgery

## 2016-07-21 DIAGNOSIS — M48061 Spinal stenosis, lumbar region without neurogenic claudication: Secondary | ICD-10-CM | POA: Diagnosis not present

## 2016-07-21 DIAGNOSIS — M47817 Spondylosis without myelopathy or radiculopathy, lumbosacral region: Secondary | ICD-10-CM | POA: Diagnosis not present

## 2016-07-21 DIAGNOSIS — M5116 Intervertebral disc disorders with radiculopathy, lumbar region: Secondary | ICD-10-CM

## 2016-07-23 ENCOUNTER — Other Ambulatory Visit (INDEPENDENT_AMBULATORY_CARE_PROVIDER_SITE_OTHER): Payer: Self-pay | Admitting: Orthopaedic Surgery

## 2016-07-23 DIAGNOSIS — M48061 Spinal stenosis, lumbar region without neurogenic claudication: Secondary | ICD-10-CM

## 2016-08-04 ENCOUNTER — Ambulatory Visit (INDEPENDENT_AMBULATORY_CARE_PROVIDER_SITE_OTHER): Payer: Medicare Other | Admitting: Orthopaedic Surgery

## 2016-08-07 ENCOUNTER — Ambulatory Visit
Admission: RE | Admit: 2016-08-07 | Discharge: 2016-08-07 | Disposition: A | Discharge status: Home / Self Care | Payer: Medicare Other | Source: Ambulatory Visit | Attending: Orthopaedic Surgery | Admitting: Orthopaedic Surgery

## 2016-08-07 DIAGNOSIS — M48061 Spinal stenosis, lumbar region without neurogenic claudication: Secondary | ICD-10-CM

## 2016-08-12 ENCOUNTER — Ambulatory Visit (INDEPENDENT_AMBULATORY_CARE_PROVIDER_SITE_OTHER): Payer: Self-pay | Admitting: Orthopaedic Surgery

## 2016-09-07 ENCOUNTER — Telehealth (INDEPENDENT_AMBULATORY_CARE_PROVIDER_SITE_OTHER): Payer: Self-pay | Admitting: *Deleted

## 2016-09-07 NOTE — Telephone Encounter (Signed)
Please advise 

## 2016-09-07 NOTE — Telephone Encounter (Signed)
Pt called requesting his MRI results that was done on 11/3. CB: [phone]

## 2016-09-08 ENCOUNTER — Encounter (INDEPENDENT_AMBULATORY_CARE_PROVIDER_SITE_OTHER): Payer: Self-pay | Admitting: Orthopaedic Surgery

## 2016-09-08 ENCOUNTER — Encounter (INDEPENDENT_AMBULATORY_CARE_PROVIDER_SITE_OTHER): Payer: Self-pay

## 2016-09-08 ENCOUNTER — Ambulatory Visit (INDEPENDENT_AMBULATORY_CARE_PROVIDER_SITE_OTHER): Payer: Medicare Other | Admitting: Orthopaedic Surgery

## 2016-09-08 VITALS — BP 139/71 | HR 72 | Ht 71.0 in | Wt 210.0 lb

## 2016-09-08 DIAGNOSIS — M1711 Unilateral primary osteoarthritis, right knee: Secondary | ICD-10-CM | POA: Diagnosis not present

## 2016-09-08 DIAGNOSIS — M48062 Spinal stenosis, lumbar region with neurogenic claudication: Secondary | ICD-10-CM | POA: Insufficient documentation

## 2016-09-08 NOTE — Telephone Encounter (Signed)
Pt seen in office scan reviewed I had called him and left message on his phone a few hrs after he called. He is scheduled for surgery 12/13

## 2016-09-08 NOTE — Progress Notes (Signed)
Office Visit Note   Patient: Peter Walker           Date of Birth: 07-21-1946           MRN: [id number] Visit Date: 09/08/2016              Requested by: Jonathon Jordan, MD [address] [place], [place] [address] PCP: Lilian Coma, MD   Assessment & Plan: Visit Diagnoses:  1. Unilateral primary osteoarthritis, right knee   2. Spinal stenosis of lumbar region with neurogenic claudication            With L4-5 right intraspinal extradural facet cyst  Plan: Patient has lumbar disc degeneration some retrolisthesis at L2-3 and also L5-S1. He has central disc protrusion L5-S1 that contacts but does not displace either S1 nerve root. His principal finding is a large facet cyst at takes over one half of the canal. It measures 13 x 14 mm cross-section with severe central stenosis and the severe right L5 nerve root impingement. He has some central disc bulge at this level but the principal problem is the large facet cyst. I reviewed the scan with patient and family. I gave him a copy of the report. We discussed surgical options. Exam progressive symptoms and this point is bothering his bowling which is his passion. Despite having some retrolisthesis at several levels and disc degeneration with the not recommend a fusion. He understands a simple cyst the excision with overnight stay would be recommended. He understands that there is a 10% chance of the cysts can recur. If he gets recurrence of the cyst with compression we discussed the surgical stabilization and fusion. Risks of a dural tear disc herniation need for rub fusion at some point in the future all possibilities. Procedure is discussed skin was reviewed patient would like to proceed. We discussed the cardiac risks reviewed his PAD. Diabetic perioperative control discussed.  Follow-Up Instructions: No Follow-up on file.   Orders:  No orders of the defined types were placed in this encounter.  No orders of the defined  types were placed in this encounter.     Procedures: No procedures performed   Clinical Data: No additional findings.   Subjective: Chief Complaint  Patient presents with  . Lower Back - Pain    Patient returns to review MRI lumbar spine. The pain level that he has depends on how active he is walking. He takes Meloxicam and Hydrocodone prior to bowling.  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 Currently states yesterday stand up quickly volar and then quickly sit down within 20 seconds otherwise his right leg gets weak and gives way. His had progressive symptoms over the last several months and this prevents him from a community ambulation without stopping every 100-200 feet to sit and rest. After 45 minutes he can resume upright position and begin ambulating again.  Review of Systems  Constitutional: Negative for chills and diaphoresis.  HENT: Negative for ear discharge, ear pain and nosebleeds.   Eyes: Negative for discharge and visual disturbance.  Respiratory: Negative for cough, choking and shortness of breath.   Cardiovascular: Negative for chest pain and palpitations.       Positive for history of peripheral arterial disease and HTN previous angioplasty 2003 and 2005  Gastrointestinal: Negative for abdominal distention and abdominal pain.  Endocrine: Negative for cold intolerance and heat intolerance.  Genitourinary: Negative for flank pain and  hematuria.  Musculoskeletal: Positive for back pain.       Positive for claudication symptoms pain with standing more than 30 seconds. He gets relief with sitting position and leg extension both at the hip and knee  Skin: Negative for rash and wound.  Neurological: Positive for numbness. Negative for seizures and speech difficulty.  Hematological: Negative for adenopathy. Does not bruise/bleed easily.  Psychiatric/Behavioral: Negative for agitation and suicidal ideas.      Objective: Vital Signs: BP 139/71   Pulse 72   Ht 5\' 11"  (1.803 m)   Wt 210 lb (95.3 kg)   BMI 29.29 kg/m   Physical Exam  Constitutional: He is oriented to person, place, and time. He appears well-developed and well-nourished.  HENT:  Head: Normocephalic and atraumatic.  Eyes: EOM are normal. Pupils are equal, round, and reactive to light.  Neck: Normal range of motion. No tracheal deviation present. No thyromegaly present.  Cardiovascular: Normal rate.   Pulmonary/Chest: Effort normal. He has no wheezes.  Abdominal: Soft. Bowel sounds are normal.  Musculoskeletal:  Positive straight-leg raising on the right at 70. Positive sciatic notch tenderness subtrochanteric bursal tenderness. Anterior tib EHL is strong. With standing patient has the right dorsal and lateral foot numbness and decreased sensation. This occurs after 30 seconds.  Neurological: He is alert and oriented to person, place, and time.  Skin: Skin is warm and dry. Capillary refill takes less than 2 seconds.  Psychiatric: He has a normal mood and affect. His behavior is normal. Judgment and thought content normal.    Ortho Exam no synovitis with good range of motion elbows wrists fingers. Reflexes are 2+ and symmetrical negative logroll the hips. Knees reach full extension. Anterior tib EHL is the normal in sitting position. Negative straight leg raising on the left side no SI motor weakness on the left. He has signed notch tenderness on the right pain with straight leg raising at 70. 2+ popliteal pulse on the left absent on the right.  Specialty Comments:  No specialty comments available.  Imaging: No results found.  AM  PACS Images   Show images for MR LUMBAR SPINE WO CONTRAST  Study Result   CLINICAL DATA:  Low back pain extending to the RIGHT leg with numbness in the foot.  EXAM: MRI LUMBAR SPINE WITHOUT CONTRAST  TECHNIQUE: Multiplanar, multisequence MR imaging of the lumbar spine  was performed. No intravenous contrast was administered.  COMPARISON:  Lumbar spine radiographs 07/21/2016. MRI lumbar spine 04/03/2013.  FINDINGS: Segmentation:  5 lumbar-type vertebral bodies are present.  Alignment:  5 mm retrolisthesis L2-3.  5 mm retrolisthesis L5-S1.  Vertebrae: No worrisome osseous lesion. Interval stability of 9 mm inferior endplate lesion at L4 on the LEFT, likely atypical hemangioma or subchondral cyst.  Conus medullaris: Extends to the L1 level and appears normal.  Paraspinal and other soft tissues: Renal cystic disease, incompletely evaluated. Aortic atherosclerosis.  Disc levels:  L1-L2:  Normal.  L2-L3: 5 mm retrolisthesis. Central protrusion with biforaminal extension, worse on the RIGHT. RIGHT greater than LEFT L2 nerve root impingement is likely.  L3-L4: Annular bulge. Foraminal protrusion on the RIGHT. RIGHT L3 nerve root impingement is possible.  L4-L5: Central protrusion. Facet arthropathy with large synovial cyst on the RIGHT measuring 13 x 14 mm cross-section. Moderate stenosis. RIGHT greater than LEFT L5 nerve root impingement. BILATERAL foraminal narrowing affect the RIGHT greater than LEFT L4 nerve roots.  L5-S1: 5 mm retrolisthesis. Central disc extrusion. This contacts, but does not significantly  displace, either S1 nerve root. BILATERAL foraminal narrowing is moderate to severe, potentially affecting either L5 nerve root.  Compared with 2014, similar appearance.  IMPRESSION: The dominant RIGHT-sided abnormality is at L4-5 where a central disc protrusion in conjunction with a large greater than 1 cm synovial cyst combine to result in moderate stenosis and RIGHT greater than LEFT L4 and L5 nerve root impingement.  Potentially symptomatic RIGHT-sided neural impingement is also observed at L5-S1, L3-4, and L2-3. See comments above.   Electronically Signed   By: Staci Righter M.D.   On: 08/07/2016 09:50     PMFS History: Patient Active Problem List   Diagnosis Date Noted  . PAD (peripheral artery disease) ([facility]) 12/10/2015  . Peripheral vascular disease, unspecified 07/27/2012  . Atherosclerosis of native arteries of the extremities with intermittent claudication 01/26/2012   Past Medical History:  Diagnosis Date  . Aneurysm, common iliac artery ([facility]) 02/23/2001   Left  and Severe claudication Left leg  secondary to  iliac occlusive  disease 03/06/2004  . Degenerative joint disease   . Diabetes mellitus   . Hypertension     Family History  Problem Relation Age of Onset  . Diabetes Mother   . Heart disease Mother   . Diabetes Father   . Hyperlipidemia Father   . Hypertension Father   . Heart disease Father     Past Surgical History:  Procedure Laterality Date  . ANGIOPLASTY     2003 AND 2005   Social History   Occupational History  . Not on file.   Social History Main Topics  . Smoking status: Former Smoker    Packs/day: 2.00    Years: 37.00    Types: Cigarettes    Quit date: 04/15/2004  . Smokeless tobacco: Never Used  . Alcohol use No  . Drug use: No  . Sexual activity: Not on file

## 2016-09-09 NOTE — Addendum Note (Signed)
Addended by: Lianne Cure A on: 09/09/2016 03:43 PM   Modules accepted: Orders

## 2016-09-15 ENCOUNTER — Encounter (HOSPITAL_COMMUNITY): Payer: Self-pay | Admitting: *Deleted

## 2016-09-15 NOTE — Progress Notes (Addendum)
Peter Walker reports that he doe snot check CBG's often and that last time he checked it it was 190.  Patient's endocrinologist is Dr Suzette Battiest, patient is due a check up now but had to cancel due to other commitments.  Patient reports that he see her every 6 months and that last A1C was high.  I requested office notes from Dr Christeen Douglas office.  PCP is Dr Stephanie Acre, I requested last office notes and any EKG, cardiac workup. Peter Walker denies chest pain or shortness of breath at rest.  Patient takes Amlodipine at hs, I instructed him to not take any medication in am, unless he needed Vicodin.  I asked patient to stop Celebrex, Vitamins and any Herbal medications. I asked patient to take 70%  Or 28 units of 70/30 Insulin tonight, and no Insulin or pills for DM in am.  I instructed patient to check CBG to check CBG and if it is less than 70 to treat it with Glucose Gel, Glucose tablets or 1/2 cup of clear juice like apple juice or cranberry juice, or 1/2 cup of regular soda. (not cream soda). I instructed patient to recheck CBG in 15 minutes and if CBG is not greater than 70, to  Call [phone] (pre- op). If it is before pre-op opens to retreat as before and recheck CBG in 15 minutes. I told patient to make note of time that liquid is taken and amount, that surgical time may have to be adjusted. Patient and his wife repeated instructions.   I called Peter Walker at Dr Lorin Mercy office and left a message that we need orders and asked her since patient is a diabetic on Insulin if they have any instructions for changing time of NPO.

## 2016-09-16 ENCOUNTER — Ambulatory Visit (HOSPITAL_COMMUNITY): Admission: RE | Admit: 2016-09-16 | Payer: Medicare Other | Source: Ambulatory Visit | Admitting: Orthopaedic Surgery

## 2016-09-16 SURGERY — LUMBAR LAMINECTOMY/DECOMPRESSION MICRODISCECTOMY
Anesthesia: General

## 2016-09-17 ENCOUNTER — Encounter (HOSPITAL_COMMUNITY): Payer: Self-pay | Admitting: Vascular Surgery

## 2016-09-17 NOTE — Progress Notes (Signed)
Patient's surgery was originally scheduled for 09/16/16 but was canceled due to insurance.    Patient stated that he has not had any changes in his medical history since he spoke to the nurse on 12/12.  Patient informed nurse that he has been checking his blood sugar 3 times a day for the last 2 days and that his fasting glucose was in the 130's and that the highest reading he has had was 201.  Patient encouraged to continue checking his blood sugar.  Patient given instructions to check blood sugar the morning of surgery and if it is less than 70, to treat it with glucose gel, glucose tablets, or 1/2 cup of apple or cranberry juice.  Nurse instructed to recheck CBG in 15 minutes and if CBG is not greater than 70, call [phone].  Patient verbalized understanding.    Nurse also instructed patient not to take any medication the morning of surgery unless he needed his pain medication.  Patient was instructed to stop taking Celebrex, Aspirin, Aleve, all vitamins and minerals.  Per diabetic protocol, patient was instructed to take 70% (28 units) of 70/30 Insulin tonight and no insulin or diabetes medication the morning of surgery.     Per labs from Dr. Almetta Lovely office, patient's last A1C was 12.6 on 05/27/16.  Patient informed nurse that he was supposed to have an appointment with Dr. Chalmers Cater but canceled it due to scheduled surgery.

## 2016-09-17 NOTE — Progress Notes (Addendum)
Anesthesia Chart Review: SAME DAY WORK-UP.  Patient is a 70 year old male scheduled for right L4 hemilaminectomy, excision of intraspinal, extradural facet cyst on 09/18/16 by Dr. Lorin Mercy.  History includes former smoker (quit '05), HTN, PAD s/p bilateral CIA stents (left 02/23/01 and 03/06/14; right 2003), DM2.   - PCP is Dr. Jonathon Jordan. Last office note received was from 12/06/15. A1c 10.6 at that time. No EKG or other cardiac studies received.   - Endocrinologist is Dr. Chalmers Cater. Last office note received was from 05/27/16. A1c documented was 12.6% with sugars before supper in the 60's and fasting sugars reported as 110-140. She continued Novolog 70/30 50 Units AM and 40 Units PM and Januvia 100 mg daily. According to his medication reconciliation form he is also using NovoLog 70/30 3 times a day with meals. (When our nursing staff initially called patient on 09/15/16, he told her that he does not consistently check his home CBGs, but last check was 190. Since then, we received Dr. Almetta Lovely records indicating poorly controlled DM as of 05/2016. Our PAT nurse called to follow-up again with him today. Since talking with our first nurse on 09/15/16, he has been checking his glucose reading TID with fasting results running around 130 and the highest overall glucose was 201.)  - Vascular surgeon is Dr. Servando Snare.   Meds includes amlodipine, ASA 81 mg, Lipitor, Celebrex, Norco, glucosamine-chondroitin, Novolog 70/30, Januvia, losartan-HCTZ, Aleve PM.  He will need an EKG and labs on arrival.   I have left a voice message with [name] at Dr. Lorin Mercy' office regarding history of poorly controlled DM2 (A1c 12.6% 05/2016). I will also route my note to her and Dr. Lorin Mercy. Currently, patient reports glucose readings 130-201 (over the past 48 hours) which if accurate would be acceptable for him to proceed. However, without a more current A1c it is impossible to know his what overall DM control is right now. If surgery  remains as scheduled he can get fasting labs on arrival including an A1c. Consider DM Coordinator consult post-operatively.  Peter Walker [facility]/Anesthesiology Phone [phone] 09/17/2016 11:59 AM

## 2016-09-18 ENCOUNTER — Ambulatory Visit (HOSPITAL_COMMUNITY): Admission: RE | Admit: 2016-09-18 | Payer: Medicare Other | Source: Ambulatory Visit | Admitting: Orthopaedic Surgery

## 2016-09-18 HISTORY — DX: Peripheral vascular disease, unspecified: I73.9

## 2016-09-18 SURGERY — LUMBAR LAMINECTOMY/DECOMPRESSION MICRODISCECTOMY
Anesthesia: General

## 2016-10-07 DIAGNOSIS — E1121 Type 2 diabetes mellitus with diabetic nephropathy: Secondary | ICD-10-CM | POA: Diagnosis not present

## 2016-10-07 DIAGNOSIS — I10 Essential (primary) hypertension: Secondary | ICD-10-CM | POA: Diagnosis not present

## 2016-10-07 DIAGNOSIS — E7800 Pure hypercholesterolemia, unspecified: Secondary | ICD-10-CM | POA: Diagnosis not present

## 2016-10-07 DIAGNOSIS — E1165 Type 2 diabetes mellitus with hyperglycemia: Secondary | ICD-10-CM | POA: Diagnosis not present

## 2016-11-10 ENCOUNTER — Telehealth (INDEPENDENT_AMBULATORY_CARE_PROVIDER_SITE_OTHER): Payer: Self-pay | Admitting: Orthopaedic Surgery

## 2016-11-10 DIAGNOSIS — E1121 Type 2 diabetes mellitus with diabetic nephropathy: Secondary | ICD-10-CM | POA: Diagnosis not present

## 2016-11-10 DIAGNOSIS — E1165 Type 2 diabetes mellitus with hyperglycemia: Secondary | ICD-10-CM | POA: Diagnosis not present

## 2016-11-10 DIAGNOSIS — I10 Essential (primary) hypertension: Secondary | ICD-10-CM | POA: Diagnosis not present

## 2016-11-10 DIAGNOSIS — E7800 Pure hypercholesterolemia, unspecified: Secondary | ICD-10-CM | POA: Diagnosis not present

## 2016-11-10 NOTE — Telephone Encounter (Signed)
PATIENT CALLED WITH SEVERAL QUESTIONS ABOUT HIS BACK SURGERY. NEEDS SURGERY ASAP, IF NOT HE WAS WONDERING IF HE COULD GET AN EPIDURAL FROM DR Ernestina Patches. CB # [phone]

## 2016-11-10 NOTE — Telephone Encounter (Signed)
Please advise 

## 2016-11-10 NOTE — Telephone Encounter (Signed)
noted 

## 2016-11-10 NOTE — Telephone Encounter (Signed)
I called and discussed. He is at Dr. Suzette Battiest office his endocrinologist. He states his sugar is 158 right now. They do the A1c. He states his last A1c was 10 days of being and will be better now. I discussed with them he needs to have a significantly improved A1c to proceed with surgical intervention. He can discuss insulin and coverage with his endocrinologist. If his endocrinologist approves  we could repeat the epidural which he was requesting. We rediscussed with him problems with an epidural with the cortisone and the potential for hypoglycemia which connects to make his A1c worse. He understands and will let us know what she says and what his A1c results are.

## 2016-11-16 ENCOUNTER — Telehealth (INDEPENDENT_AMBULATORY_CARE_PROVIDER_SITE_OTHER): Payer: Self-pay

## 2016-11-16 NOTE — Telephone Encounter (Signed)
Called and scheduled him for 11/18/16 @ 1:15 w/driver

## 2016-11-16 NOTE — Telephone Encounter (Signed)
ok 

## 2016-11-16 NOTE — Telephone Encounter (Signed)
Patient left voicemail requesting another injection asap. Says he has already spoken with Dr. Lorin Mercy about this. See 11/10/16 phone message. Had Rt L5 TF 06/03/16. Ok to repeat?

## 2016-11-18 ENCOUNTER — Ambulatory Visit (INDEPENDENT_AMBULATORY_CARE_PROVIDER_SITE_OTHER): Payer: Medicare HMO | Admitting: Physical Medicine and Rehabilitation

## 2016-11-18 ENCOUNTER — Ambulatory Visit (INDEPENDENT_AMBULATORY_CARE_PROVIDER_SITE_OTHER): Payer: Self-pay

## 2016-11-18 ENCOUNTER — Encounter (INDEPENDENT_AMBULATORY_CARE_PROVIDER_SITE_OTHER): Payer: Self-pay | Admitting: Physical Medicine and Rehabilitation

## 2016-11-18 VITALS — BP 143/71 | HR 63

## 2016-11-18 DIAGNOSIS — M5416 Radiculopathy, lumbar region: Secondary | ICD-10-CM | POA: Diagnosis not present

## 2016-11-18 MED ORDER — BUPIVACAINE HCL 0.5 % IJ SOLN
50.0000 mL | Freq: Once | INTRAMUSCULAR | Status: AC
  Administered 2016-11-18: 50 mL

## 2016-11-18 MED ORDER — LIDOCAINE HCL (PF) 1 % IJ SOLN
0.3300 mL | Freq: Once | INTRAMUSCULAR | Status: AC
  Administered 2016-11-18: 0.3 mL

## 2016-11-18 MED ORDER — METHYLPREDNISOLONE ACETATE 80 MG/ML IJ SUSP
80.0000 mg | Freq: Once | INTRAMUSCULAR | Status: AC
  Administered 2016-11-18: 80 mg

## 2016-11-18 NOTE — Progress Notes (Signed)
Peter Walker - 71 y.o. male MRN [id number]  Date of birth: January 12, 1946  Office Visit Note: Visit Date: 11/18/2016 PCP: Lilian Coma, MD Referred by: Jonathon Jordan, MD  Subjective: Chief Complaint  Patient presents with  . Lower Back - Pain   HPI: Peter Walker is a 71 year old gentleman who is well-known to [place]. He is followed by Dr. Lorin Mercy. He is complaining of increasing chronic right side lower back pain. Worse with lifting. Radiates down leg and across top of foot . Feels pain primarily in right hip. Pain complaints are very similar to usual. He is having more back and hip pain than leg pain at this point but is still problematic. He is actually set to undergo surgery of the lumbar spine for decompression but is having issues with his hemoglobin A1c. He is an insulin-dependent diabetic. They haven't been working with him in terms of getting the shot done for symptomatic relief until he can lower his A1c values for surgery.    ROS Otherwise per HPI.  Assessment & Plan: Visit Diagnoses:  1. Lumbar radiculopathy     Plan: Findings:  Right L5 transforaminal epidural steroid injection.    Meds & Orders:  Meds ordered this encounter  Medications  . lidocaine (PF) (XYLOCAINE) 1 % injection 0.3 mL  . methylPREDNISolone acetate (DEPO-MEDROL) injection 80 mg  . bupivacaine (MARCAINE) 0.5 % (with pres) injection 50 mL    Orders Placed This Encounter  Procedures  . XR C-ARM NO REPORT  . Epidural Steroid injection    Follow-up: Return if symptoms worsen or fail to improve.   Procedures: No procedures performed  Lumbosacral Transforaminal Epidural Steroid Injection - Infraneural Approach with Fluoroscopic Guidance  Patient: Peter Walker      Date of Birth: [birth year] MRN: [id number] PCP: Lilian Coma, MD      Visit Date: 11/18/2016   Universal Protocol:    Date/Time: 02/14/181:42 PM  Consent Given By: the patient  Position: PRONE   Additional Comments: Vital  signs were monitored before and after the procedure. Patient was prepped and draped in the usual sterile fashion. The correct patient, procedure, and site was verified.   Injection Procedure Details:  Procedure Site One Meds Administered:  Meds ordered this encounter  Medications  . lidocaine (PF) (XYLOCAINE) 1 % injection 0.3 mL  . methylPREDNISolone acetate (DEPO-MEDROL) injection 80 mg  . bupivacaine (MARCAINE) 0.5 % (with pres) injection 50 mL      Laterality: Right  Location/Site:  L5-S1  Needle size: 22 G  Needle type: Spinal  Needle Placement: Transforaminal  Findings:  -Contrast Used: 2 mL iohexol 180 mg iodine/mL   -Comments: Excellent flow of contrast along the nerve and into the epidural space.  Procedure Details: After squaring off the end-plates of the desired vertebral level to get a true AP view, the C-arm was obliqued to the painful side so that the superior articulating process is positioned about 1/3 the length of the inferior endplate.  The needle was aimed toward the junction of the superior articular process and the transverse process of the inferior vertebrae. The needle's initial entry is in the lower third of the foramen through Kambin's triangle. The soft tissues overlying this target were infiltrated with 2-3 ml. of 1% Lidocaine without Epinephrine.  The spinal needle was then inserted and advanced toward the target using a "trajectory" view along the fluoroscope beam.  Under AP and lateral visualization, the needle was advanced so it did not puncture dura and  did not traverse medially beyond the 6 o'clock position of the pedicle. Bi-planar projections were used to confirm position. Aspiration was confirmed to be negative for CSF and/or blood. A 1-2 ml. volume of Isovue-250 was injected and flow of contrast was noted at each level. Radiographs were obtained for documentation purposes.   After attaining the desired flow of contrast documented above, a 0.5  to 1.0 ml test dose of 0.25% Marcaine was injected into each respective transforaminal space.  The patient was observed for 90 seconds post injection.  After no sensory deficits were reported, and normal lower extremity motor function was noted,   the above injectate was administered so that equal amounts of the injectate were placed at each foramen (level) into the transforaminal epidural space.   Additional Comments:  The patient tolerated the procedure well Dressing: Band-Aid    Post-procedure details: Patient was observed during the procedure. Post-procedure instructions were reviewed.  Patient left the clinic in stable condition.   Clinical History: No specialty comments available.  He reports that he quit smoking about 12 years ago. His smoking use included Cigarettes. He has a 74.00 pack-year smoking history. He has never used smokeless tobacco. No results for input(s): HGBA1C, LABURIC in the last 8760 hours.  Objective:  VS:  HT:    WT:   BMI:     BP:(!) 143/71  HR:63bpm  TEMP: ( )  RESP:98 % Physical Exam  Musculoskeletal:  Patient ambulates without aid with a fairly good distal strength.    Ortho Exam Imaging: Xr C-arm No Report  Result Date: 11/18/2016 Please see Notes or Procedures tab for imaging impression.   Past Medical/Family/Surgical/Social History: Medications & Allergies reviewed per EMR Patient Active Problem List   Diagnosis Date Noted  . Spinal stenosis of lumbar region with neurogenic claudication 09/08/2016  . PAD (peripheral artery disease) ([facility]) 12/10/2015  . Peripheral vascular disease, unspecified 07/27/2012  . Atherosclerosis of native arteries of the extremities with intermittent claudication 01/26/2012   Past Medical History:  Diagnosis Date  . Aneurysm, common iliac artery ([place]) 02/23/2001   no hx of CIA aneurysm seen 09/17/16, but has PAD with iliac occlusive disease; Left  and Severe claudication Left leg  secondary to  iliac occlusive   disease 03/06/2004  . Degenerative joint disease   . Diabetes mellitus    Type II  . Hypertension   . PAD (peripheral artery disease) ([facility])    left CIA stents '02, '05, right CIA stent '03   Family History  Problem Relation Age of Onset  . Diabetes Mother   . Heart disease Mother   . Diabetes Father   . Hyperlipidemia Father   . Hypertension Father   . Heart disease Father    Past Surgical History:  Procedure Laterality Date  . ANGIOPLASTY     common iliac artery L '02, '05; R '03   Social History   Occupational History  . Not on file.   Social History Main Topics  . Smoking status: Former Smoker    Packs/day: 2.00    Years: 37.00    Types: Cigarettes    Quit date: 03/12/2004  . Smokeless tobacco: Never Used  . Alcohol use No  . Drug use: No  . Sexual activity: Not on file

## 2016-11-18 NOTE — Procedures (Signed)
Lumbosacral Transforaminal Epidural Steroid Injection - Infraneural Approach with Fluoroscopic Guidance  Patient: Peter Walker      Date of Birth: Mar 10, 1946 MRN: [id number] PCP: Lilian Coma, MD      Visit Date: 11/18/2016   Universal Protocol:    Date/Time: 02/14/181:42 PM  Consent Given By: the patient  Position: PRONE   Additional Comments: Vital signs were monitored before and after the procedure. Patient was prepped and draped in the usual sterile fashion. The correct patient, procedure, and site was verified.   Injection Procedure Details:  Procedure Site One Meds Administered:  Meds ordered this encounter  Medications  . lidocaine (PF) (XYLOCAINE) 1 % injection 0.3 mL  . methylPREDNISolone acetate (DEPO-MEDROL) injection 80 mg  . bupivacaine (MARCAINE) 0.5 % (with pres) injection 50 mL      Laterality: Right  Location/Site:  L5-S1  Needle size: 22 G  Needle type: Spinal  Needle Placement: Transforaminal  Findings:  -Contrast Used: 2 mL iohexol 180 mg iodine/mL   -Comments: Excellent flow of contrast along the nerve and into the epidural space.  Procedure Details: After squaring off the end-plates of the desired vertebral level to get a true AP view, the C-arm was obliqued to the painful side so that the superior articulating process is positioned about 1/3 the length of the inferior endplate.  The needle was aimed toward the junction of the superior articular process and the transverse process of the inferior vertebrae. The needle's initial entry is in the lower third of the foramen through Kambin's triangle. The soft tissues overlying this target were infiltrated with 2-3 ml. of 1% Lidocaine without Epinephrine.  The spinal needle was then inserted and advanced toward the target using a "trajectory" view along the fluoroscope beam.  Under AP and lateral visualization, the needle was advanced so it did not puncture dura and did not traverse medially beyond  the 6 o'clock position of the pedicle. Bi-planar projections were used to confirm position. Aspiration was confirmed to be negative for CSF and/or blood. A 1-2 ml. volume of Isovue-250 was injected and flow of contrast was noted at each level. Radiographs were obtained for documentation purposes.   After attaining the desired flow of contrast documented above, a 0.5 to 1.0 ml test dose of 0.25% Marcaine was injected into each respective transforaminal space.  The patient was observed for 90 seconds post injection.  After no sensory deficits were reported, and normal lower extremity motor function was noted,   the above injectate was administered so that equal amounts of the injectate were placed at each foramen (level) into the transforaminal epidural space.   Additional Comments:  The patient tolerated the procedure well Dressing: Band-Aid    Post-procedure details: Patient was observed during the procedure. Post-procedure instructions were reviewed.  Patient left the clinic in stable condition.

## 2016-11-18 NOTE — Patient Instructions (Signed)

## 2016-11-26 DIAGNOSIS — M48062 Spinal stenosis, lumbar region with neurogenic claudication: Secondary | ICD-10-CM | POA: Diagnosis not present

## 2016-12-18 ENCOUNTER — Ambulatory Visit: Payer: Medicare HMO | Admitting: Vascular Surgery

## 2016-12-18 ENCOUNTER — Encounter (HOSPITAL_COMMUNITY): Payer: Medicare HMO

## 2017-01-27 ENCOUNTER — Encounter: Payer: Self-pay | Admitting: Vascular Surgery

## 2017-01-29 DIAGNOSIS — E038 Other specified hypothyroidism: Secondary | ICD-10-CM | POA: Diagnosis not present

## 2017-01-29 DIAGNOSIS — E1122 Type 2 diabetes mellitus with diabetic chronic kidney disease: Secondary | ICD-10-CM | POA: Diagnosis not present

## 2017-01-29 DIAGNOSIS — E1165 Type 2 diabetes mellitus with hyperglycemia: Secondary | ICD-10-CM | POA: Diagnosis not present

## 2017-01-29 DIAGNOSIS — E785 Hyperlipidemia, unspecified: Secondary | ICD-10-CM | POA: Diagnosis not present

## 2017-01-29 DIAGNOSIS — Z0000 Encounter for general adult medical examination without abnormal findings: Secondary | ICD-10-CM | POA: Diagnosis not present

## 2017-01-29 DIAGNOSIS — Z1159 Encounter for screening for other viral diseases: Secondary | ICD-10-CM | POA: Diagnosis not present

## 2017-01-29 DIAGNOSIS — Z79899 Other long term (current) drug therapy: Secondary | ICD-10-CM | POA: Diagnosis not present

## 2017-01-29 DIAGNOSIS — I739 Peripheral vascular disease, unspecified: Secondary | ICD-10-CM | POA: Diagnosis not present

## 2017-01-29 DIAGNOSIS — N182 Chronic kidney disease, stage 2 (mild): Secondary | ICD-10-CM | POA: Diagnosis not present

## 2017-01-29 DIAGNOSIS — E1121 Type 2 diabetes mellitus with diabetic nephropathy: Secondary | ICD-10-CM | POA: Diagnosis not present

## 2017-02-05 ENCOUNTER — Encounter: Payer: Self-pay | Admitting: Vascular Surgery

## 2017-02-05 ENCOUNTER — Ambulatory Visit (INDEPENDENT_AMBULATORY_CARE_PROVIDER_SITE_OTHER): Payer: Medicare HMO | Admitting: Vascular Surgery

## 2017-02-05 ENCOUNTER — Ambulatory Visit (INDEPENDENT_AMBULATORY_CARE_PROVIDER_SITE_OTHER)
Admission: RE | Admit: 2017-02-05 | Discharge: 2017-02-05 | Disposition: A | Discharge status: Home / Self Care | Payer: Medicare HMO | Source: Ambulatory Visit | Attending: Vascular Surgery | Admitting: Vascular Surgery

## 2017-02-05 ENCOUNTER — Ambulatory Visit (HOSPITAL_COMMUNITY)
Admission: RE | Admit: 2017-02-05 | Discharge: 2017-02-05 | Disposition: A | Discharge status: Home / Self Care | Payer: Medicare HMO | Source: Ambulatory Visit | Attending: Vascular Surgery | Admitting: Vascular Surgery

## 2017-02-05 VITALS — BP 163/67 | HR 67 | Temp 97.2°F | Resp 20 | Ht 71.0 in | Wt 218.0 lb

## 2017-02-05 DIAGNOSIS — Z48812 Encounter for surgical aftercare following surgery on the circulatory system: Secondary | ICD-10-CM

## 2017-02-05 DIAGNOSIS — I739 Peripheral vascular disease, unspecified: Secondary | ICD-10-CM

## 2017-02-05 NOTE — Progress Notes (Signed)
Patient ID: Peter Walker, male   DOB: 01-22-1946, 71 y.o.   MRN: [id number]  Reason for Consult: PAD (6 mth f/u with Aorto-Iliac, ABI. )   Referred by Jonathon Jordan, MD  Subjective:     HPI:  Peter Walker is a 71 y.o. male with diabetes and former smoker who I last saw on September follow-up of his bilateral iliac artery stents. He continues to have right hip pain radiating down to his right foot. He has been scheduled for back surgery in the past but this has not happened yet. He does take aspirin daily as well as statin drug. He quit smoking several years ago after having his iliac artery stents placed. He is not endorsing any claudication tissue loss or ulceration. The pain is positional does not occur with activity. He has no issues related to today's visit.  Past Medical History:  Diagnosis Date  . Aneurysm, common iliac artery ([facility]) 02/23/2001   no hx of CIA aneurysm seen 09/17/16, but has PAD with iliac occlusive disease; Left  and Severe claudication Left leg  secondary to  iliac occlusive  disease 03/06/2004  . Degenerative joint disease   . Diabetes mellitus    Type II  . Hypertension   . PAD (peripheral artery disease) ([facility])    left CIA stents '02, '05, right CIA stent '03   Family History  Problem Relation Age of Onset  . Diabetes Mother   . Heart disease Mother   . Diabetes Father   . Hyperlipidemia Father   . Hypertension Father   . Heart disease Father    Past Surgical History:  Procedure Laterality Date  . ANGIOPLASTY     common iliac artery L '02, '05; R '03    Short Social History:  Social History  Substance Use Topics  . Smoking status: Former Smoker    Packs/day: 2.00    Years: 37.00    Types: Cigarettes    Quit date: 03/12/2004  . Smokeless tobacco: Never Used  . Alcohol use No    Allergies  Allergen Reactions  . No Known Allergies     Current Outpatient Prescriptions  Medication Sig Dispense Refill  . amLODipine (NORVASC) 5 MG tablet  Take 5 mg by mouth daily.    Marland Kitchen aspirin 81 MG tablet Take 81 mg by mouth daily.    Marland Kitchen atorvastatin (LIPITOR) 20 MG tablet Take 20 mg by mouth daily. Reported on 12/10/2015    . Bioflavonoid Products (VITAMIN C) CHEW Chew 1 tablet by mouth daily.    . celecoxib (CELEBREX) 200 MG capsule Take 200 mg by mouth 2 (two) times daily.     Marland Kitchen glucosamine-chondroitin 500-400 MG tablet Take 1 tablet by mouth daily.     Marland Kitchen HYDROcodone-acetaminophen (NORCO/VICODIN) 5-325 MG tablet Take 1 tablet by mouth every 6 (six) hours as needed for moderate pain.     Marland Kitchen insulin aspart protamine-insulin aspart (NOVOLOG MIX 70/30) (70-30) 100 UNIT/ML injection Inject 10-50 Units into the skin 3 (three) times daily with meals. 50 units the AM, 10 Lunch, 40 in PM    . JANUVIA 100 MG tablet Take 100 mg by mouth daily.     Marland Kitchen losartan-hydrochlorothiazide (HYZAAR) 100-12.5 MG tablet Take 1 tablet by mouth daily.      No current facility-administered medications for this visit.     Review of Systems  Constitutional:  Constitutional negative. Respiratory: Respiratory negative.  Cardiovascular: Cardiovascular negative.  GI: Gastrointestinal negative.  Musculoskeletal: Positive for leg  pain and joint pain.  Hematologic: Hematologic/lymphatic negative.        Objective:  Objective   Vitals:   02/05/17 0930  BP: (!) 163/67  Pulse: 67  Resp: 20  Temp: 97.2 F (36.2 C)  TempSrc: Oral  SpO2: 98%  Weight: 218 lb (98.9 kg)  Height: 5\' 11"  (1.803 m)   Body mass index is 30.4 kg/m.  Physical Exam  Constitutional: He is oriented to person, place, and time. He appears well-developed.  HENT:  Head: Normocephalic.  Eyes: Pupils are equal, round, and reactive to light.  Neck: Normal range of motion.  Cardiovascular: Normal rate.   Pulses:      Carotid pulses are 2+ on the right side, and 2+ on the left side.      Radial pulses are 2+ on the right side, and 2+ on the left side.       Femoral pulses are 2+ on the left  side. I cannot reliably feel his femoral pulses. Signals at dp/pt bilaterally  Pulmonary/Chest: Effort normal.  Abdominal: Soft. He exhibits no mass.  Musculoskeletal: Normal range of motion. He exhibits no edema.  Neurological: He is alert and oriented to person, place, and time.  Skin: Skin is warm and dry.  Psychiatric: He has a normal mood and affect. Judgment and thought content normal.    Data: I have independently interpreted his ABIs today to be 0.65 on the right 1.02 on the left.  Duplex demonstrates patent stents bilaterally with greater than 50% stenoses. On the right side he has a velocity of 419 (290) which is increased from previous. Peak systolic velocity on the left to 72.     Assessment/Plan:     71 year old male history of bilateral iliac artery stents in the past the right side in 2003 the left sided 2005. He now has what appears to be significant in-stent stenosis on the right with risk factor of ongoing diabetes and elevated A1c. I have offered him aortogram with likely restenting of his in-stent stenosis on the right and evaluation of the left as well as lower extremity angiography to evaluate. Post-stenting he would need to be on Plavix for at least 3 months this may interrupt his back surgery. I discussed the risk and benefits of the procedure with him including groin complication injury to blood vessels or nerves possibly requiring larger surgery or stay in the hospital. I'll otherwise expect him to have outpatient surgery and be able to perform all activities within 48 hours. I told him this is unlikely to cure his right lower extremity pain but should prevent him from having stent occlusion in the future. He states that he will call to schedule.     Waynetta Sandy MD [facility]

## 2017-02-08 DIAGNOSIS — E1165 Type 2 diabetes mellitus with hyperglycemia: Secondary | ICD-10-CM | POA: Diagnosis not present

## 2017-02-08 DIAGNOSIS — E1121 Type 2 diabetes mellitus with diabetic nephropathy: Secondary | ICD-10-CM | POA: Diagnosis not present

## 2017-02-08 DIAGNOSIS — E7800 Pure hypercholesterolemia, unspecified: Secondary | ICD-10-CM | POA: Diagnosis not present

## 2017-02-08 DIAGNOSIS — I10 Essential (primary) hypertension: Secondary | ICD-10-CM | POA: Diagnosis not present

## 2017-03-08 IMAGING — MR MR LUMBAR SPINE W/O CM
4 of 5 series · 25 of 48 positions shown · non-contrast
Comparison: Lumbar spine radiographs 07/21/2016. MRI lumbar spine
04/03/2013.

CLINICAL DATA: Low back pain extending to the RIGHT leg with
numbness in the foot.

EXAM:
MRI LUMBAR SPINE WITHOUT CONTRAST
TECHNIQUE: Multiplanar, multisequence MR imaging of the lumbar spine was
performed. No intravenous contrast was administered.

[Series 4: T2 post-contrast · sagittal · 4.0mm · 0.55mm/px · 6 of 15 slices shown]
[im 1/15]
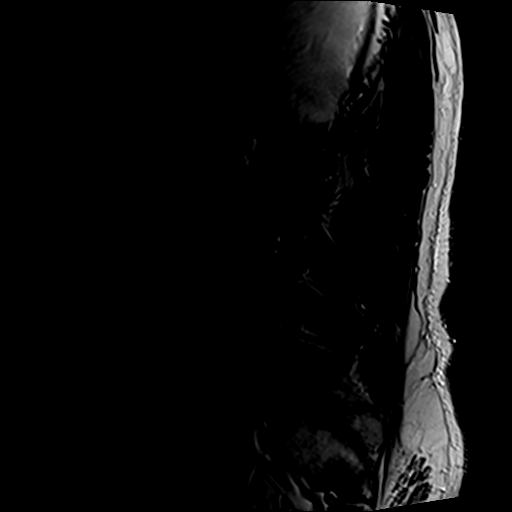
[im 3/15]
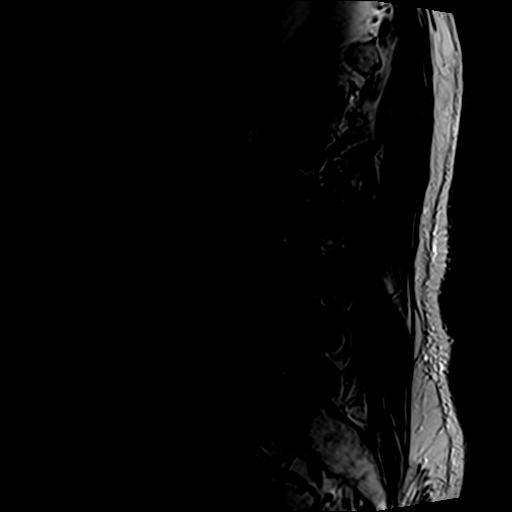
[im 6/15]
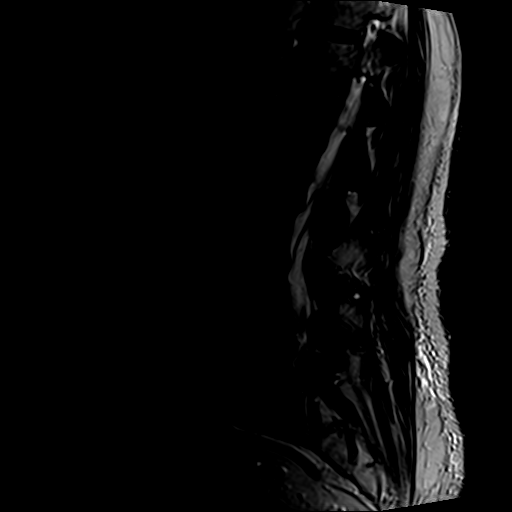
[im 9/15]
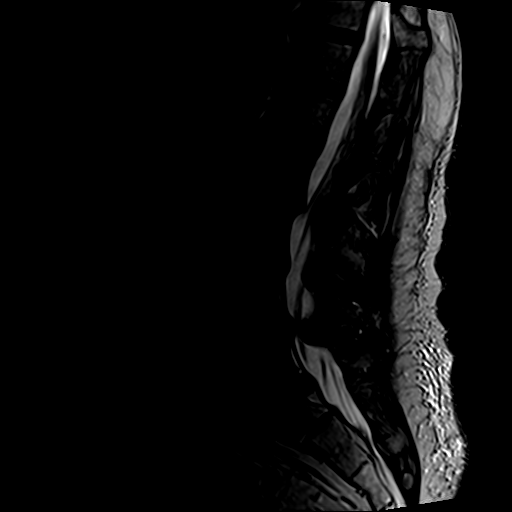
[im 12/15]
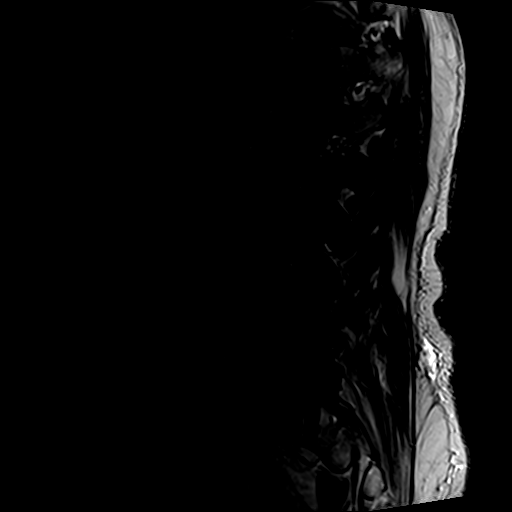
[im 15/15]
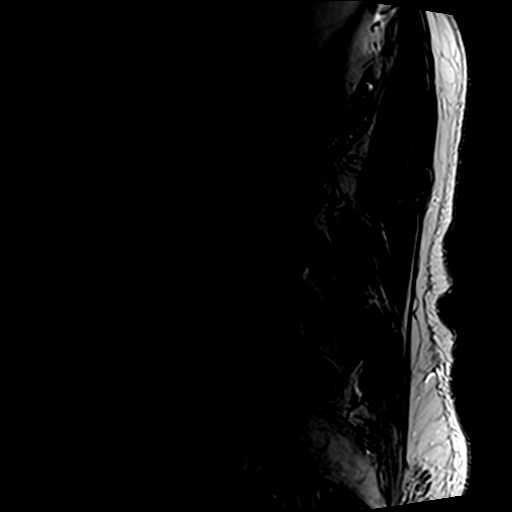

[Series 6: T1 · sagittal · 4.0mm · 0.55mm/px · 6 of 15 slices shown (1 of 2)]
[im 1/15]
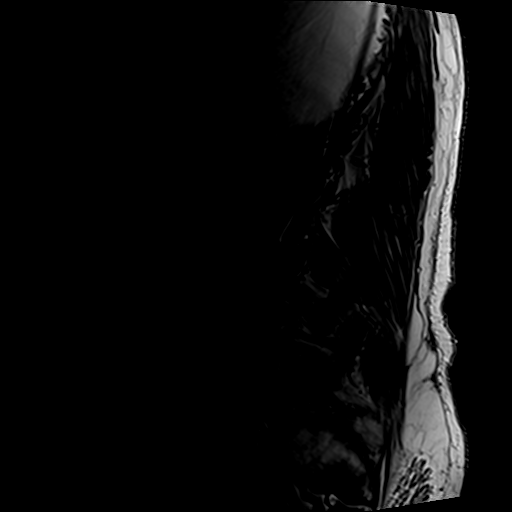
[im 3/15]
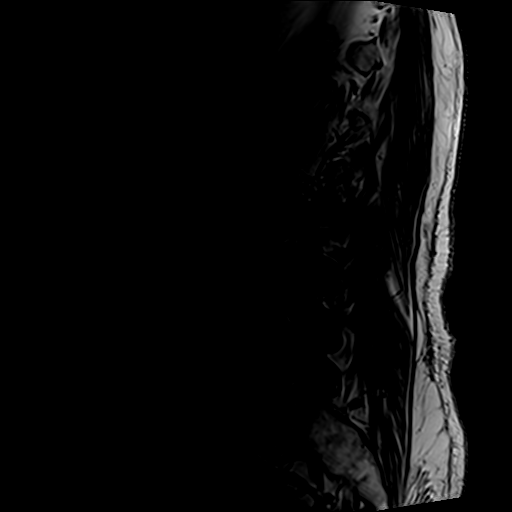
[im 6/15]
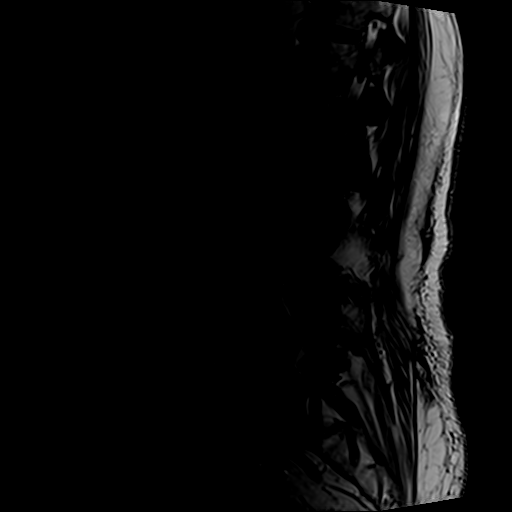
[im 9/15]
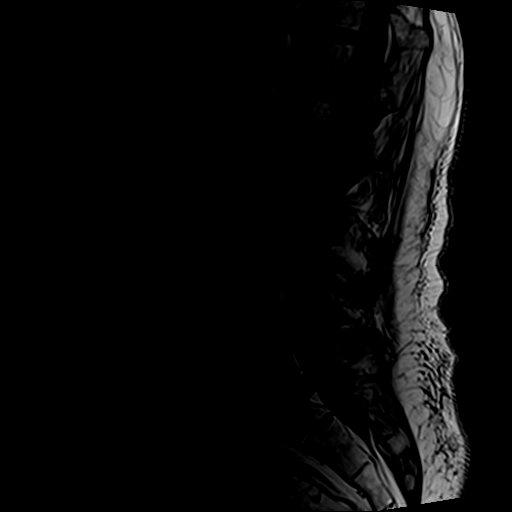
[im 12/15]
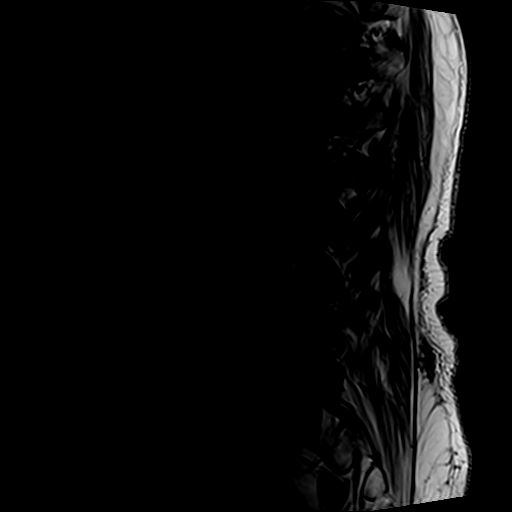
[im 15/15]
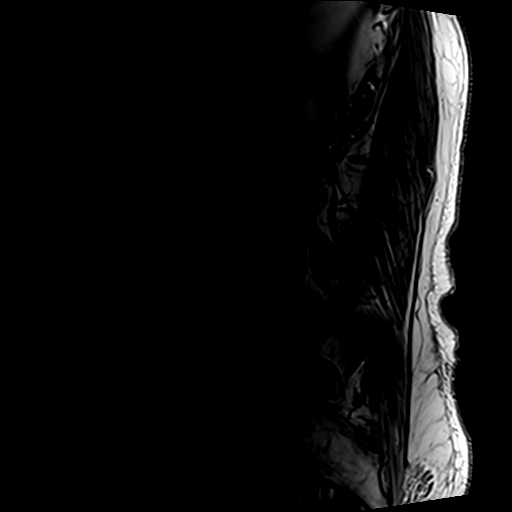

[Series 7: T1 · axial · 4.0mm · 0.35mm/px · z∈[-24,+147]mm · 4 of 35 slices shown (2 of 2)]
[im 1/35]
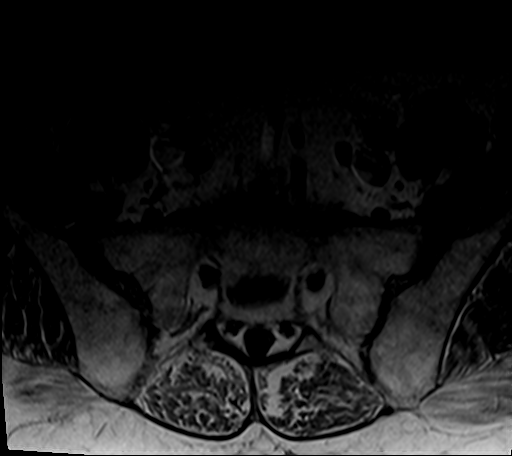
[im 5/35]
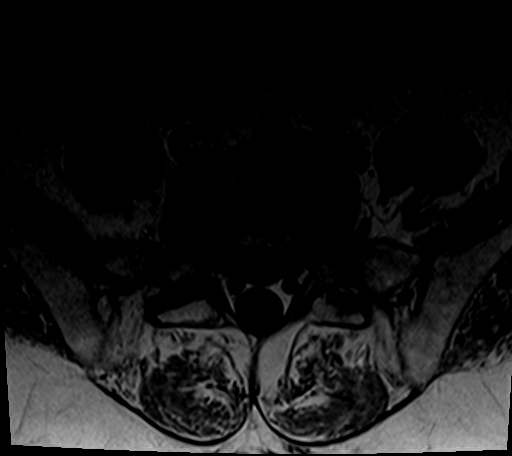
[im 18/35]
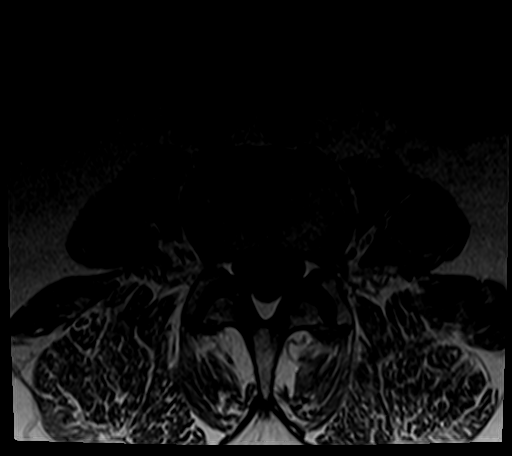
[im 30/35]
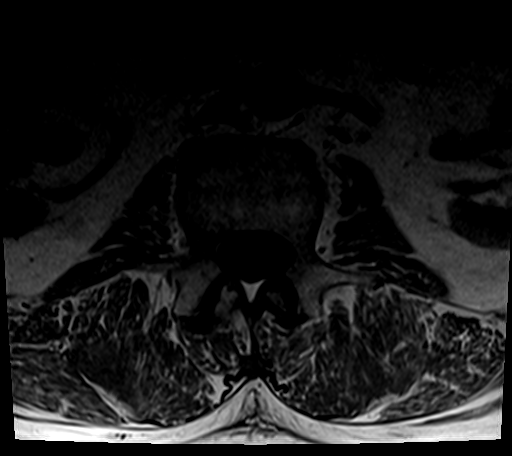

[Series 8: T2 · axial · 4.0mm · 0.70mm/px · z∈[-24,+172]mm · 9 of 35 slices shown]
[im 1/35]
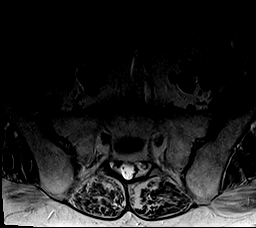
[im 5/35]
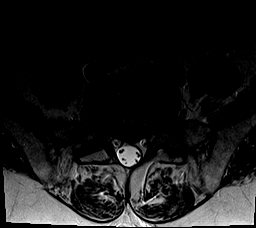
[im 10/35]
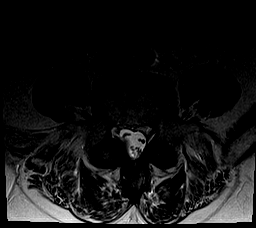
[im 15/35]
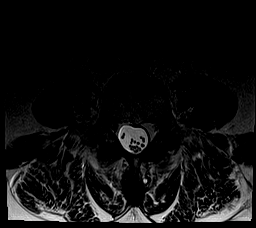
[im 18/35]
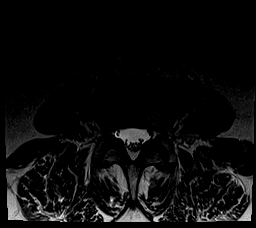
[im 20/35]
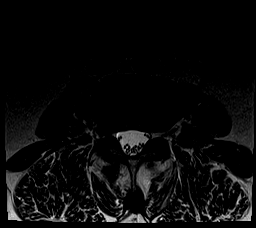
[im 25/35]
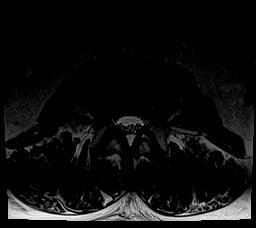
[im 30/35]
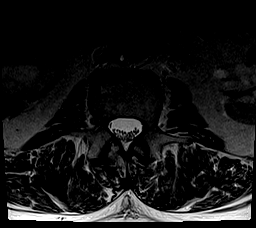
[im 35/35]
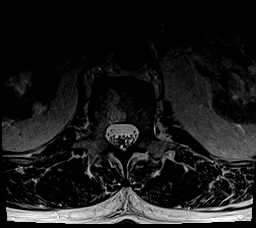

[25 of 48 positions shown; findings below may reference images not displayed]

FINDINGS: Segmentation:  5 lumbar-type vertebral bodies are present.

Alignment:  5 mm retrolisthesis L2-3.  5 mm retrolisthesis L5-S1.

Vertebrae: No worrisome osseous lesion. Interval stability of 9 mm
inferior endplate lesion at L4 on the LEFT, likely atypical
hemangioma or subchondral cyst.

Conus medullaris: Extends to the L1 level and appears normal.

Paraspinal and other soft tissues: Renal cystic disease,
incompletely evaluated. Aortic atherosclerosis.

Disc levels:

L1-L2:  Normal.

L2-L3: 5 mm retrolisthesis. Central protrusion with biforaminal
extension, worse on the RIGHT. RIGHT greater than LEFT L2 nerve root
impingement is likely.

L3-L4: Annular bulge. Foraminal protrusion on the RIGHT. RIGHT L3
nerve root impingement is possible.

L4-L5: Central protrusion. Facet arthropathy with large synovial
cyst on the RIGHT measuring 13 x 14 mm cross-section. Moderate
stenosis. RIGHT greater than LEFT L5 nerve root impingement.
BILATERAL foraminal narrowing affect the RIGHT greater than LEFT L4
nerve roots.

L5-S1: 5 mm retrolisthesis. Central disc extrusion. This contacts,
but does not significantly displace, either S1 nerve root. BILATERAL
foraminal narrowing is moderate to severe, potentially affecting
either L5 nerve root.

Compared with 3479, similar appearance.
IMPRESSION: The dominant RIGHT-sided abnormality is at L4-5 where a central disc
protrusion in conjunction with a large greater than 1 cm synovial
cyst combine to result in moderate stenosis and RIGHT greater than
LEFT L4 and L5 nerve root impingement.

Potentially symptomatic RIGHT-sided neural impingement is also
observed at L5-S1, L3-4, and L2-3. See comments above.

## 2017-03-12 ENCOUNTER — Other Ambulatory Visit: Payer: Self-pay

## 2017-03-25 ENCOUNTER — Telehealth: Payer: Self-pay | Admitting: Vascular Surgery

## 2017-03-25 ENCOUNTER — Encounter (HOSPITAL_COMMUNITY)
Admission: RE | Disposition: A | Discharge status: Home / Self Care | Payer: Self-pay | Source: Ambulatory Visit | Attending: Vascular Surgery

## 2017-03-25 ENCOUNTER — Ambulatory Visit (HOSPITAL_COMMUNITY)
Admission: RE | Admit: 2017-03-25 | Discharge: 2017-03-25 | Disposition: A | Discharge status: Home / Self Care | Payer: Medicare HMO | Source: Ambulatory Visit | Attending: Vascular Surgery | Admitting: Vascular Surgery

## 2017-03-25 DIAGNOSIS — I10 Essential (primary) hypertension: Secondary | ICD-10-CM | POA: Diagnosis not present

## 2017-03-25 DIAGNOSIS — Z794 Long term (current) use of insulin: Secondary | ICD-10-CM | POA: Insufficient documentation

## 2017-03-25 DIAGNOSIS — E1151 Type 2 diabetes mellitus with diabetic peripheral angiopathy without gangrene: Secondary | ICD-10-CM | POA: Diagnosis not present

## 2017-03-25 DIAGNOSIS — Z7982 Long term (current) use of aspirin: Secondary | ICD-10-CM | POA: Insufficient documentation

## 2017-03-25 DIAGNOSIS — Z87891 Personal history of nicotine dependence: Secondary | ICD-10-CM | POA: Diagnosis not present

## 2017-03-25 DIAGNOSIS — Z8249 Family history of ischemic heart disease and other diseases of the circulatory system: Secondary | ICD-10-CM | POA: Insufficient documentation

## 2017-03-25 DIAGNOSIS — I70203 Unspecified atherosclerosis of native arteries of extremities, bilateral legs: Secondary | ICD-10-CM | POA: Insufficient documentation

## 2017-03-25 DIAGNOSIS — I70221 Atherosclerosis of native arteries of extremities with rest pain, right leg: Secondary | ICD-10-CM | POA: Diagnosis not present

## 2017-03-25 DIAGNOSIS — I739 Peripheral vascular disease, unspecified: Secondary | ICD-10-CM | POA: Diagnosis present

## 2017-03-25 HISTORY — PX: PERIPHERAL VASCULAR INTERVENTION: CATH118257

## 2017-03-25 HISTORY — PX: LOWER EXTREMITY ANGIOGRAPHY: CATH118251

## 2017-03-25 LAB — GLUCOSE, CAPILLARY
GLUCOSE-CAPILLARY: 146 mg/dL — AB (ref 65–99)
Glucose-Capillary: 198 mg/dL — ABNORMAL HIGH (ref 65–99)
Glucose-Capillary: 141 mg/dL — ABNORMAL HIGH (ref 65–99)
Glucose-Capillary: 153 mg/dL — ABNORMAL HIGH (ref 65–99)

## 2017-03-25 LAB — POCT ACTIVATED CLOTTING TIME
ACTIVATED CLOTTING TIME: 158 s
Activated Clotting Time: 197 seconds
Activated Clotting Time: 175 seconds

## 2017-03-25 SURGERY — PERIPHERAL VASCULAR INTERVENTION
Anesthesia: LOCAL | Laterality: Right

## 2017-03-25 MED ORDER — LIDOCAINE HCL (PF) 1 % IJ SOLN
INTRAMUSCULAR | Status: DC | PRN
  Administered 2017-03-25: 15 mL via SUBCUTANEOUS

## 2017-03-25 MED ORDER — SODIUM CHLORIDE 0.9 % IV SOLN
INTRAVENOUS | Status: DC
  Administered 2017-03-25: 08:00:00 via INTRAVENOUS

## 2017-03-25 MED ORDER — OXYCODONE-ACETAMINOPHEN 5-325 MG PO TABS: 1.0000 | ORAL_TABLET | ORAL | Status: DC | PRN

## 2017-03-25 MED ORDER — HEPARIN SODIUM (PORCINE) 1000 UNIT/ML IJ SOLN
INTRAMUSCULAR | Status: DC | PRN
  Administered 2017-03-25: 10000 [IU] via INTRAVENOUS

## 2017-03-25 MED ORDER — HEPARIN (PORCINE) IN NACL 2-0.9 UNIT/ML-% IJ SOLN
INTRAMUSCULAR | Status: AC
  Filled 2017-03-25: qty 1000

## 2017-03-25 MED ORDER — MORPHINE SULFATE (PF) 4 MG/ML IV SOLN: 2.0000 mg | INTRAVENOUS | Status: DC | PRN

## 2017-03-25 MED ORDER — CLOPIDOGREL BISULFATE 75 MG PO TABS
300.0000 mg | ORAL_TABLET | Freq: Once | ORAL | Status: AC
  Administered 2017-03-25: 300 mg via ORAL

## 2017-03-25 MED ORDER — HYDRALAZINE HCL 20 MG/ML IJ SOLN
10.0000 mg | Freq: Once | INTRAMUSCULAR | Status: AC
  Administered 2017-03-25: 10 mg via INTRAVENOUS

## 2017-03-25 MED ORDER — HEPARIN (PORCINE) IN NACL 2-0.9 UNIT/ML-% IJ SOLN
INTRAMUSCULAR | Status: DC | PRN
  Administered 2017-03-25: 11:00:00

## 2017-03-25 MED ORDER — HEPARIN (PORCINE) IN NACL 2-0.9 UNIT/ML-% IJ SOLN: INTRAMUSCULAR | Status: DC | PRN

## 2017-03-25 MED ORDER — HYDRALAZINE HCL 20 MG/ML IJ SOLN
INTRAMUSCULAR | Status: AC
  Filled 2017-03-25: qty 1

## 2017-03-25 MED ORDER — CLOPIDOGREL BISULFATE 75 MG PO TABS: 75.0000 mg | ORAL_TABLET | Freq: Every day | ORAL | 3 refills | Status: DC

## 2017-03-25 MED ORDER — SODIUM CHLORIDE 0.9 % IV SOLN: 1.0000 mL/kg/h | INTRAVENOUS | Status: DC

## 2017-03-25 MED ORDER — CLOPIDOGREL BISULFATE 300 MG PO TABS
ORAL_TABLET | ORAL | Status: AC
  Administered 2017-03-25: 300 mg via ORAL
  Filled 2017-03-25: qty 1

## 2017-03-25 MED ORDER — IODIXANOL 320 MG/ML IV SOLN
INTRAVENOUS | Status: DC | PRN
  Administered 2017-03-25: 120 mL via INTRA_ARTERIAL

## 2017-03-25 MED ORDER — LIDOCAINE HCL (PF) 1 % IJ SOLN
INTRAMUSCULAR | Status: AC
  Filled 2017-03-25: qty 30

## 2017-03-25 MED ORDER — HEPARIN SODIUM (PORCINE) 1000 UNIT/ML IJ SOLN
INTRAMUSCULAR | Status: AC
  Filled 2017-03-25: qty 1

## 2017-03-25 SURGICAL SUPPLY — 18 items
BALLN ARMADA 10X20X80 (BALLOONS) ×4
BALLOON ARMADA 10X20X80 (BALLOONS) ×1 IMPLANT
CATH ANGIO 5F BER2 65CM (CATHETERS) ×2 IMPLANT
CATH OMNI FLUSH 5F 65CM (CATHETERS) ×2 IMPLANT
COVER PRB 48X5XTLSCP FOLD TPE (BAG) ×1 IMPLANT
COVER PROBE 5X48 (BAG) ×4
GUIDEWIRE ANGLED .035X150CM (WIRE) ×2 IMPLANT
KIT ENCORE 26 ADVANTAGE (KITS) ×2 IMPLANT
KIT MICROINTRODUCER STIFF 5F (SHEATH) ×2 IMPLANT
KIT PV (KITS) ×4 IMPLANT
SHEATH BRITE TIP 7FR 35CM (SHEATH) ×2 IMPLANT
SHEATH PINNACLE 5F 10CM (SHEATH) ×2 IMPLANT
STENT VIABAHN 7X29X80 VBX (Permanent Stent) ×2 IMPLANT
STOPCOCK MORSE 400PSI 3WAY (MISCELLANEOUS) ×2 IMPLANT
SYR MEDRAD MARK V 150ML (SYRINGE) ×4 IMPLANT
TRANSDUCER W/STOPCOCK (MISCELLANEOUS) ×4 IMPLANT
TRAY PV CATH (CUSTOM PROCEDURE TRAY) ×4 IMPLANT
WIRE BENTSON .035X145CM (WIRE) ×2 IMPLANT

## 2017-03-25 NOTE — Telephone Encounter (Signed)
Sched appt 05/05/17; lab at 9:00 and MD at 10:15. Lm on cell# for pt to confirm appt.

## 2017-03-25 NOTE — Op Note (Signed)
    Patient name: Peter Walker MRN: [id number] DOB: Jan 23, 1946 Sex: male  03/25/2017 Pre-operative Diagnosis: right lower extremity pain with decreased abi and right common iliac artery stenosis Post-operative diagnosis:  Same Surgeon:  Peter Walker. Peter Matters, MD Procedure Performed: 1.  US guided cannulation of right common femoral artery 2.  Aortogram with bilateral lower extremity runoff 3.  Stent of right common iliac artery with 7x71mm VBX  Indications:   [name] with history of iliac stents on the left. He has elevated velocity in the right common iliac artery which was presumed to be a stent in his right hip and lower extremity pain with ABI 0.6 relative to 1.0 on the left. He is therefore indicated for aortogram possible intervention.  Findings: His previous stents are actually both on the left and the common iliac artery. They are patent although the hypogastric on the left is occluded. On the right side there is a greater than 80% stenosis of the very proximal common iliac artery with 50% stenosis of the right hypogastric artery. Following intervention with stent and post dilatation there is 0% residual stenosis in the common iliac artery on the right  The lower extremity is appeared to be free of disease with dominant runoff posterior tibial bilaterally.   Procedure:  The patient was identified in the holding area and taken to room 8.  The patient was then placed supine on the table and prepped and draped in the usual sterile fashion.  A time out was called.  Ultrasound was used to evaluate the right common femoral artery.  It was patent .  A digital ultrasound image was acquired.  A micropuncture needle was used to access the right common femoral artery under ultrasound guidance.  An 018 wire was advanced without resistance and a micropuncture sheath was placed.  The 018 wire was removed and a benson wire was placed.  The micropuncture sheath was exchanged for a 5 french sheath.  A Glidewire  and BER catheter was used to traverse the very tight common iliac artery on the right and then performed angiogram demonstrated we were intraluminal. Following this we exchanged for a Omni Flush catheter performed aortogram with the identified stenosis of the right common iliac artery. We then exchanged for long 7 French sheath through the lesion and the patient was heparinized. We measured our lesion to be approximately 20 mm with a contralateral 7 mm stent. We then brought a 7 x 29 mm stent to the field and deployed this. Angiogram demonstrated that it was undersized at the site of the post stenotic dilatation. We then postdilated with a 10 mm balloon and angiogram demonstrated good apposition of the walls with flow through the both common iliac arteries that was now equal bilaterally. We then performed bilateral lower extremity runoff of the lower extremities which demonstrated posterior tibials is the dominant runoff and no flow limiting stenoses throughout the lower extremities. The wire was removed sheath withdrawn into the right external iliac artery. Patient tolerated procedure well without immediate complication.  Contrast: 120cc   Peter Walker C. Peter Matters, MD [facility] [place] Office: [phone] Pager: [phone]

## 2017-03-25 NOTE — Progress Notes (Addendum)
Site area: RFA Site Prior to Removal:  Level 0 Pressure Applied For: 30 min Manual:  yes  Patient Status During Pull:  stable Post Pull Site:  Level 0 Post Pull Instructions Given:  yes Post Pull Pulses Present: palpable Dressing Applied: tegaderm  Bedrest begins @ 1300 1700 Comments:

## 2017-03-25 NOTE — Discharge Instructions (Signed)

## 2017-03-25 NOTE — Progress Notes (Signed)
Dr. Donzetta Matters notified of patients complaint of pain in the right hip that is in character to be similar if not the same pain of what he had before the procedure at 7/10.

## 2017-03-25 NOTE — Progress Notes (Signed)
Pt got up at 2015 and ambulated around the unit until 2035.  Pt's groin looks the same. No further bleeding. Pt and family feel comfortable with discharge plans reiterated discharge instructions with pt and wife.  Both verbalize understanding and deny further questions. Discharged to home.

## 2017-03-25 NOTE — Telephone Encounter (Signed)
-----   Message from Mena Goes, RN sent at 03/25/2017 12:45 PM EDT ----- Regarding: RE: 4-6 weeks w/ ABI and aortoiliac duplex Yes it is ----- Message ----- From: Georgiann Mccoy Sent: 03/25/2017  12:27 PM To: Mena Goes, RN Subject: RE: 4-6 weeks w/ ABI and aortoiliac duplex     Is AI duplex aorto-iliac?  ----- Message ----- From: Mena Goes, RN Sent: 03/25/2017  11:45 AM To: [facility]ole[facility] Rose Admin Pool Subject: 4-6 weeks w/ ABI and aortoiliac duplex           ----- Message ----- From: Waynetta Sandy, MD Sent: 03/25/2017  10:58 AM To: [facility] [name] [id number] [birth year]  03/25/2017 Pre-operative Diagnosis: right lower extremity pain with decreased abi and right common iliac artery stenosis  Surgeon:  Erlene Quan C. Donzetta Matters, MD  Procedure Performed: 1.  US guided cannulation of right common femoral artery 2.  Aortogram with bilateral lower extremity runoff 3.  Stent of right common iliac artery with 7x31mm VBX  F/u in 4-6 weeks with abi and AI duplex

## 2017-03-25 NOTE — Progress Notes (Signed)
Patient ambulated 10 minutes. Groin check post ambulation revealed firm palpable level 2 hematoma 2 inches round  Pressure held for 30 minutes from 1715 until 1745.  Dr Donzetta Matters notified and 2 additional hours of bedrest ordered.  Site after holding pressure in now soft to palpation but bruised.  Patient currently with wife at bedside eating his dinner.  Reported off to Massie Maroon RN.

## 2017-03-25 NOTE — H&P (Signed)
Patient ID: Peter Walker, male   DOB: 1946-05-07, 71 y.o.   MRN: [id number]  Reason for Consult: PAD (6 mth f/u with Aorto-Iliac, ABI. )   Referred by Jonathon Jordan, MD  Subjective:     HPI:  Peter Walker is a 71 y.o. male with diabetes and former smoker who I last saw on September follow-up of his bilateral iliac artery stents. He continues to have right hip pain radiating down to his right foot. He has been scheduled for back surgery in the past but this has not happened yet. He does take aspirin daily as well as statin drug. He quit smoking several years ago after having his iliac artery stents placed. He is not endorsing any claudication tissue loss or ulceration. The pain is positional does not occur with activity. He has no issues related to today's visit.      Past Medical History:  Diagnosis Date  . Aneurysm, common iliac artery ([place]) 02/23/2001   no hx of CIA aneurysm seen 09/17/16, but has PAD with iliac occlusive disease; Left  and Severe claudication Left leg  secondary to  iliac occlusive  disease 03/06/2004  . Degenerative joint disease   . Diabetes mellitus    Type II  . Hypertension   . PAD (peripheral artery disease) ([facility])    left CIA stents '02, '05, right CIA stent '03        Family History  Problem Relation Age of Onset  . Diabetes Mother   . Heart disease Mother   . Diabetes Father   . Hyperlipidemia Father   . Hypertension Father   . Heart disease Father         Past Surgical History:  Procedure Laterality Date  . ANGIOPLASTY     common iliac artery L '02, '05; R '03    Short Social History:       Social History  Substance Use Topics  . Smoking status: Former Smoker    Packs/day: 2.00    Years: 37.00    Types: Cigarettes    Quit date: 03/12/2004  . Smokeless tobacco: Never Used  . Alcohol use No        Allergies  Allergen Reactions  . No Known Allergies           Current Outpatient Prescriptions   Medication Sig Dispense Refill  . amLODipine (NORVASC) 5 MG tablet Take 5 mg by mouth daily.    Marland Kitchen aspirin 81 MG tablet Take 81 mg by mouth daily.    Marland Kitchen atorvastatin (LIPITOR) 20 MG tablet Take 20 mg by mouth daily. Reported on 12/10/2015    . Bioflavonoid Products (VITAMIN C) CHEW Chew 1 tablet by mouth daily.    . celecoxib (CELEBREX) 200 MG capsule Take 200 mg by mouth 2 (two) times daily.     Marland Kitchen glucosamine-chondroitin 500-400 MG tablet Take 1 tablet by mouth daily.     Marland Kitchen HYDROcodone-acetaminophen (NORCO/VICODIN) 5-325 MG tablet Take 1 tablet by mouth every 6 (six) hours as needed for moderate pain.     Marland Kitchen insulin aspart protamine-insulin aspart (NOVOLOG MIX 70/30) (70-30) 100 UNIT/ML injection Inject 10-50 Units into the skin 3 (three) times daily with meals. 50 units the AM, 10 Lunch, 40 in PM    . JANUVIA 100 MG tablet Take 100 mg by mouth daily.     Marland Kitchen losartan-hydrochlorothiazide (HYZAAR) 100-12.5 MG tablet Take 1 tablet by mouth daily.      No current facility-administered medications for this  visit.     Review of Systems  Constitutional:  Constitutional negative. Respiratory: Respiratory negative.  Cardiovascular: Cardiovascular negative.  GI: Gastrointestinal negative.  Musculoskeletal: Positive for leg pain and joint pain.  Hematologic: Hematologic/lymphatic negative.        Objective:  Objective      Vitals:   02/05/17 0930  BP: (!) 163/67  Pulse: 67  Resp: 20  Temp: 97.2 F (36.2 C)  TempSrc: Oral  SpO2: 98%  Weight: 218 lb (98.9 kg)  Height: 5\' 11"  (1.803 m)   Body mass index is 30.4 kg/m.  Physical Exam  Constitutional: He is oriented to person, place, and time. He appears well-developed.  HENT:  Head: Normocephalic.  Eyes: Pupils are equal, round, and reactive to light.  Neck: Normal range of motion.  Cardiovascular: Normal rate.   Pulses:      Carotid pulses are 2+ on the right side, and 2+ on the left side.      Radial  pulses are 2+ on the right side, and 2+ on the left side.       Femoral pulses are 2+ on the left side. I cannot reliably feel his femoral pulses. Signals at dp/pt bilaterally  Pulmonary/Chest: Effort normal.  Abdominal: Soft. He exhibits no mass.  Musculoskeletal: Normal range of motion. He exhibits no edema.  Neurological: He is alert and oriented to person, place, and time.  Skin: Skin is warm and dry.  Psychiatric: He has a normal mood and affect. Judgment and thought content normal.    Data: I have independently interpreted his ABIs today to be 0.65 on the right 1.02 on the left.  Duplex demonstrates patent stents bilaterally with greater than 50% stenoses. On the right side he has a velocity of 419 (290) which is increased from previous. Peak systolic velocity on the left to 72.     Assessment/Plan:   71 year old male history of bilateral iliac artery stents in the past the right side in 2003 the left sided 2005. He now has what appears to be significant in-stent stenosis on the right with risk factor of ongoing diabetes and elevated A1c. Plan aortogram with possible re-stenting of his right. He understands the risks and benefits and need for plavix post operatively.   Waynetta Sandy MD [facility]

## 2017-03-25 NOTE — Progress Notes (Signed)
Report received from [facility]. I personally palpated hematoma before and after hold and agree with assessment from [facility]. Dr. Donzetta Matters in and assessed pt and spoke with him and his wife.  Bedrest ordered until 2000. Pts groin is currently soft and bruised.  Pt states it hurts much less.  VSS. Will assume care at this time

## 2017-03-26 ENCOUNTER — Encounter (HOSPITAL_COMMUNITY): Payer: Self-pay | Admitting: Vascular Surgery

## 2017-04-14 DIAGNOSIS — E1165 Type 2 diabetes mellitus with hyperglycemia: Secondary | ICD-10-CM | POA: Diagnosis not present

## 2017-04-19 ENCOUNTER — Encounter: Payer: Self-pay | Admitting: Vascular Surgery

## 2017-04-19 ENCOUNTER — Telehealth: Payer: Self-pay | Admitting: *Deleted

## 2017-04-19 NOTE — Telephone Encounter (Signed)
Returned call to patient.  He stated that he was experiencing increased swelling and numbness intermittently in his right foot and toes and his right lower leg has a tightness feeling.  An appointment was scheduled with the NP  04/20/2017 at 1:15.   Patient voiced understanding.

## 2017-04-20 ENCOUNTER — Ambulatory Visit (INDEPENDENT_AMBULATORY_CARE_PROVIDER_SITE_OTHER): Payer: Self-pay | Admitting: Family

## 2017-04-20 ENCOUNTER — Encounter: Payer: Self-pay | Admitting: Family

## 2017-04-20 VITALS — BP 130/69 | HR 66 | Temp 97.5°F | Resp 18 | Ht 71.0 in | Wt 226.0 lb

## 2017-04-20 DIAGNOSIS — I739 Peripheral vascular disease, unspecified: Secondary | ICD-10-CM

## 2017-04-20 DIAGNOSIS — R609 Edema, unspecified: Secondary | ICD-10-CM

## 2017-04-20 DIAGNOSIS — Z95828 Presence of other vascular implants and grafts: Secondary | ICD-10-CM

## 2017-04-20 NOTE — Progress Notes (Signed)
Postoperative Visit   History of Present Illness  Peter Walker is a 71 y.o. male who is s/p stent placement of right common iliac artery with 7x52mm VBX on 03-25-17 by Dr. Donzetta Walker for right lower extremity pain with decreased abi and right common iliac artery stenosis.  Findings: His previous stents are actually both on the left and the common iliac artery. They are patent although the hypogastric on the left is occluded. On the right side there is a greater than 80% stenosis of the very proximal common iliac artery with 50% stenosis of the right hypogastric artery. Following intervention with stent and post dilatation there is 0% residual stenosis in the common iliac artery on the right  The lower extremity is appeared to be free of disease with dominant runoff posterior tibial bilaterally.   He returns today with c/o increased swelling and numbness intermittently in his right foot and toes and his right lower leg has a tightness feeling. He admits to being on his feet most of the last few days.   The patient notes resolution of lower extremity symptoms.  The patient is able to complete their activities of daily living.    He is a former smoker for 37 years, quit in 2005. Pt states his last A1C was about 8. He states he has to get his A1C 7.5 or lower before he can have back surgery, states he has a herniated disc. He has resolution of his right buttock claudication after the above procedure.    For VQI Use Only  PRE-ADM LIVING: Home  AMB STATUS: Ambulatory   Past Medical History:  Diagnosis Date  . Aneurysm, common iliac artery ([place]) 02/23/2001   no hx of CIA aneurysm seen 09/17/16, but has PAD with iliac occlusive disease; Left  and Severe claudication Left leg  secondary to  iliac occlusive  disease 03/06/2004  . Degenerative joint disease   . Diabetes mellitus    Type II  . Hypertension   . PAD (peripheral artery disease) (HCC)    left CIA stents '02, '05, right CIA stent '03      Past Surgical History:  Procedure Laterality Date  . ANGIOPLASTY     common iliac artery L '02, '05; R '03  . LOWER EXTREMITY ANGIOGRAPHY  03/25/2017   Procedure: Lower Extremity Angiography;  Surgeon: Waynetta Sandy, MD;  Location: [facility];  Service: Cardiovascular;;  . PERIPHERAL VASCULAR INTERVENTION Right 03/25/2017   Procedure: Peripheral Vascular Intervention;  Surgeon: Waynetta Sandy, MD;  Location: [facility];  Service: Cardiovascular;  Laterality: Right;  common iliac    Social History   Social History  . Marital status: Married    Spouse name: N/A  . Number of children: N/A  . Years of education: N/A   Occupational History  . Not on file.   Social History Main Topics  . Smoking status: Former Smoker    Packs/day: 2.00    Years: 37.00    Types: Cigarettes    Quit date: 03/12/2004  . Smokeless tobacco: Never Used  . Alcohol use No  . Drug use: No  . Sexual activity: Not on file   Other Topics Concern  . Not on file   Social History Narrative  . No narrative on file    Allergies  Allergen Reactions  . No Known Allergies     Current Outpatient Prescriptions on File Prior to Visit  Medication Sig Dispense Refill  . amLODipine (  NORVASC) 5 MG tablet Take 5 mg by mouth daily.    Marland Kitchen atorvastatin (LIPITOR) 20 MG tablet Take 20 mg by mouth daily. Reported on 12/10/2015    . Bioflavonoid Products (VITAMIN C) CHEW Chew 1 tablet by mouth daily.    . celecoxib (CELEBREX) 200 MG capsule Take 200 mg by mouth 2 (two) times daily as needed for mild pain.     Marland Kitchen clopidogrel (PLAVIX) 75 MG tablet Take 1 tablet (75 mg total) by mouth daily. 30 tablet 3  . glucosamine-chondroitin 500-400 MG tablet Take 1 tablet by mouth daily.     Marland Kitchen HYDROcodone-acetaminophen (NORCO/VICODIN) 5-325 MG tablet Take 1 tablet by mouth every 6 (six) hours as needed for moderate pain.     Marland Kitchen insulin aspart protamine-insulin aspart (NOVOLOG MIX 70/30) (70-30) 100  UNIT/ML injection Inject 10-70 Units into the skin 3 (three) times daily with meals. 70 units the AM, 10 Lunch, 70 in PM    . JANUVIA 100 MG tablet Take 100 mg by mouth daily.     Marland Kitchen losartan-hydrochlorothiazide (HYZAAR) 100-12.5 MG tablet Take 1 tablet by mouth daily.     . Multiple Vitamin (MULTI-VITAMIN PO) Take 1 tablet by mouth daily.    Marland Kitchen MELATONIN PO Take 1 tablet by mouth at bedtime as needed (sleep).     No current facility-administered medications on file prior to visit.      Physical Examination  Vitals:   04/20/17 1309  BP: 130/69  Pulse: 66  Resp: 18  Temp: (!) 97.5 F (36.4 C)  TempSrc: Oral  SpO2: 97%  Weight: 226 lb (102.5 kg)  Height: 5\' 11"  (1.803 m)   Body mass index is 31.52 kg/m.  PHYSICAL EXAMINATION: General: The patient appears their stated age.   HEENT:  No gross abnormalities Pulmonary: Respirations are non-labored Abdomen: Soft and non-tender. Musculoskeletal: There are no major deformities.   Neurologic: No focal weakness or paresthesias are detected, Skin: There are no ulcer or rashes noted. Psychiatric: The patient has normal affect. Cardiovascular: There is a regular rate and rhythm. Right lower leg and lateral aspect of right foot with  1+ non pitting edema, right foot and toes are pink and warm with brisk capillary refill, no wounds, no signs of ischemia.   Vascular: Vessel Right Left  Radial Palpable Palpable  Aorta Not palpable N/A  Femoral Palpable Not Palpable  Popliteal palpable Not palpable  PT 2+ Palpable 2+ Palpable  DP 1+ Palpable 2+ Palpable    Medical Decision Making  Peter Walker is a 71 y.o. male who presents s/p stent placement of right common iliac artery with 7x11mm VBX on 03-25-17. His right foot has dependent and reperfusion edema; pt reports this is resolved by morning with feet elevation. He has been on his feet all day.  I instructed him and his wife how to elevated his feet and legs, see Patient Instructions.     Pt has an appointment scheduled with Dr. Donzetta Walker on 05-05-17 with ABI's and aortoiliac duplex.    Thank you for allowing [place] to participate in this patient's care.  Peter Abdallah, Sharmon Leyden, RN, MSN, FNP-C [facility] Office: [phone]  04/20/2017, 1:15 PM  Clinic MD: Early

## 2017-04-20 NOTE — Patient Instructions (Addendum)
Peripheral Vascular Disease Peripheral vascular disease (PVD) is a disease of the blood vessels that are not part of your heart and brain. A simple term for PVD is poor circulation. In most cases, PVD narrows the blood vessels that carry blood from your heart to the rest of your body. This can result in a decreased supply of blood to your arms, legs, and internal organs, like your stomach or kidneys. However, it most often affects a person's lower legs and feet. There are two types of PVD.  Organic PVD. This is the more common type. It is caused by damage to the structure of blood vessels.  Functional PVD. This is caused by conditions that make blood vessels contract and tighten (spasm).  Without treatment, PVD tends to get worse over time. PVD can also lead to acute ischemic limb. This is when an arm or limb suddenly has trouble getting enough blood. This is a medical emergency. Follow these instructions at home:  Take medicines only as told by your doctor.  Do not use any tobacco products, including cigarettes, chewing tobacco, or electronic cigarettes. If you need help quitting, ask your doctor.  Lose weight if you are overweight, and maintain a healthy weight as told by your doctor.  Eat a diet that is low in fat and cholesterol. If you need help, ask your doctor.  Exercise regularly. Ask your doctor for some good activities for you.  Take good care of your feet. ? Wear comfortable shoes that fit well. ? Check your feet often for any cuts or sores. Contact a doctor if:  You have cramps in your legs while walking.  You have leg pain when you are at rest.  You have coldness in a leg or foot.  Your skin changes.  You are unable to get or have an erection (erectile dysfunction).  You have cuts or sores on your feet that are not healing. Get help right away if:  Your arm or leg turns cold and blue.  Your arms or legs become red, warm, swollen, painful, or numb.  You have  chest pain or trouble breathing.  You suddenly have weakness in your face, arm, or leg.  You become very confused or you cannot speak.  You suddenly have a very bad headache.  You suddenly cannot see. This information is not intended to replace advice given to you by your health care provider. Make sure you discuss any questions you have with your health care provider. Document Released: 12/16/2009 Document Revised: 02/27/2016 Document Reviewed: 03/01/2014 Elsevier Interactive Patient Education  2017 [employer]    To decrease swelling in your foot and leg: Elevate feet above slightly bent knees, feet above heart, overnight and 3-4 times per day for 20 minutes.

## 2017-04-26 ENCOUNTER — Encounter: Payer: Self-pay | Admitting: Vascular Surgery

## 2017-04-28 NOTE — Addendum Note (Signed)
Addended by: Lianne Cure A on: 04/28/2017 12:05 PM   Modules accepted: Orders

## 2017-05-05 ENCOUNTER — Ambulatory Visit (INDEPENDENT_AMBULATORY_CARE_PROVIDER_SITE_OTHER): Payer: Medicare HMO | Admitting: Vascular Surgery

## 2017-05-05 ENCOUNTER — Ambulatory Visit (HOSPITAL_COMMUNITY)
Admission: RE | Admit: 2017-05-05 | Discharge: 2017-05-05 | Disposition: A | Discharge status: Home / Self Care | Payer: Medicare HMO | Source: Ambulatory Visit | Attending: Vascular Surgery | Admitting: Vascular Surgery

## 2017-05-05 ENCOUNTER — Ambulatory Visit (INDEPENDENT_AMBULATORY_CARE_PROVIDER_SITE_OTHER)
Admit: 2017-05-05 | Discharge: 2017-05-05 | Disposition: A | Discharge status: Home / Self Care | Payer: Medicare HMO | Attending: Vascular Surgery | Admitting: Vascular Surgery

## 2017-05-05 ENCOUNTER — Encounter: Payer: Self-pay | Admitting: Vascular Surgery

## 2017-05-05 VITALS — BP 139/64 | HR 58 | Temp 97.5°F | Resp 18 | Ht 71.0 in | Wt 226.0 lb

## 2017-05-05 DIAGNOSIS — I739 Peripheral vascular disease, unspecified: Secondary | ICD-10-CM

## 2017-05-05 DIAGNOSIS — R609 Edema, unspecified: Secondary | ICD-10-CM | POA: Insufficient documentation

## 2017-05-05 DIAGNOSIS — Z95828 Presence of other vascular implants and grafts: Secondary | ICD-10-CM

## 2017-05-05 NOTE — Progress Notes (Signed)
Patient ID: Peter Walker, male   DOB: December 25, 1945, 71 y.o.   MRN: [id number]  Reason for Consult: PAD (right lower leg swelling)   Referred by Peter Jordan, MD  Subjective:     HPI:  Peter Walker is a 71 y.o. male with a history of left common iliac artery stenting and had right lower extremity pain. He also has bilateral hip pain that is orthopedic in nature although he has not had intervention on this. He recently underwent stenting of his right common iliac artery. He states that now his right lower extremity pain is much better and he is having increased activity. He does have right lower extremity swelling since the procedure. He is elevating his legs at night as previously instructed in our office. Overall he is satisfied with his outcome. He is taking Plavix at the time.  Past Medical History:  Diagnosis Date  . Aneurysm, common iliac artery ([place]) 02/23/2001   no hx of CIA aneurysm seen 09/17/16, but has PAD with iliac occlusive disease; Left  and Severe claudication Left leg  secondary to  iliac occlusive  disease 03/06/2004  . Degenerative joint disease   . Diabetes mellitus    Type II  . Hypertension   . PAD (peripheral artery disease) (HCC)    left CIA stents '02, '05, right CIA stent '03   Family History  Problem Relation Age of Onset  . Diabetes Mother   . Heart disease Mother   . Diabetes Father   . Hyperlipidemia Father   . Hypertension Father   . Heart disease Father    Past Surgical History:  Procedure Laterality Date  . ANGIOPLASTY     common iliac artery L '02, '05; R '03  . LOWER EXTREMITY ANGIOGRAPHY  03/25/2017   Procedure: Lower Extremity Angiography;  Surgeon: Peter Sandy, MD;  Location: [facility];  Service: Cardiovascular;;  . PERIPHERAL VASCULAR INTERVENTION Right 03/25/2017   Procedure: Peripheral Vascular Intervention;  Surgeon: Peter Sandy, MD;  Location: [facility];  Service: Cardiovascular;   Laterality: Right;  common iliac    Short Social History:  Social History  Substance Use Topics  . Smoking status: Former Smoker    Packs/day: 2.00    Years: 37.00    Types: Cigarettes    Quit date: 03/12/2004  . Smokeless tobacco: Never Used  . Alcohol use No    Allergies  Allergen Reactions  . No Known Allergies     Current Outpatient Prescriptions  Medication Sig Dispense Refill  . amLODipine (NORVASC) 5 MG tablet Take 5 mg by mouth daily.    Marland Kitchen atorvastatin (LIPITOR) 20 MG tablet Take 20 mg by mouth daily. Reported on 12/10/2015    . Bioflavonoid Products (VITAMIN C) CHEW Chew 1 tablet by mouth daily.    . celecoxib (CELEBREX) 200 MG capsule Take 200 mg by mouth 2 (two) times daily as needed for mild pain.     Marland Kitchen clopidogrel (PLAVIX) 75 MG tablet Take 1 tablet (75 mg total) by mouth daily. 30 tablet 3  . glucosamine-chondroitin 500-400 MG tablet Take 1 tablet by mouth daily.     Marland Kitchen HYDROcodone-acetaminophen (NORCO/VICODIN) 5-325 MG tablet Take 1 tablet by mouth every 6 (six) hours as needed for moderate pain.     Marland Kitchen insulin aspart protamine-insulin aspart (NOVOLOG MIX 70/30) (70-30) 100 UNIT/ML injection Inject 10-70 Units into the skin 3 (three) times daily with meals. 70 units the AM, 10 Lunch, 70 in  PM    . JANUVIA 100 MG tablet Take 100 mg by mouth daily.     Marland Kitchen losartan-hydrochlorothiazide (HYZAAR) 100-12.5 MG tablet Take 1 tablet by mouth daily.     Marland Kitchen MELATONIN PO Take 1 tablet by mouth at bedtime as needed (sleep).    . Multiple Vitamin (MULTI-VITAMIN PO) Take 1 tablet by mouth daily.    Marland Kitchen NOVOLOG MIX 70/30 FLEXPEN (70-30) 100 UNIT/ML FlexPen   6  . RELION INSULIN SYRINGE 1ML/31G 31G X 5/16" 1 ML MISC      No current facility-administered medications for this visit.     Review of Systems  Constitutional:  Constitutional negative. Respiratory: Respiratory negative.  Cardiovascular: Positive for leg swelling.  GI: Gastrointestinal negative.  Musculoskeletal: Positive for  gait problem and leg pain.  Skin: Skin negative.  Hematologic: Hematologic/lymphatic negative.  Psychiatric: Psychiatric negative.        Objective:  Objective   Vitals:   05/05/17 1001  BP: 139/64  Pulse: (!) 58  Resp: 18  Temp: (!) 97.5 F (36.4 C)  SpO2: 97%  Weight: 226 lb (102.5 kg)  Height: 5\' 11"  (1.803 m)   Body mass index is 31.52 kg/m.  Physical Exam  Constitutional: He is oriented to person, place, and time. He appears well-developed.  HENT:  Head: Normocephalic.  Eyes: Pupils are equal, round, and reactive to light.  Neck: Normal range of motion.  Cardiovascular: Normal rate.   Pulses:      Posterior tibial pulses are 2+ on the right side.  Pulmonary/Chest: Effort normal.  Abdominal: Soft. He exhibits no mass.  Musculoskeletal: He exhibits edema.  Neurological: He is alert and oriented to person, place, and time.  Skin: Skin is warm and dry.  Psychiatric: He has a normal mood and affect. His behavior is normal. Judgment and thought content normal.    Data: I have independently interpreted his aortoiliac duplex today which demonstrates elevated velocity in the mid stent on the right.    his ABI on the right has improved to 1 from 0.65 2 months ago.    Assessment/Plan:     Mr. Peter Walker is a 71 year old male here after right common iliac artery stenting. He has postperfusion edema but otherwise his right lower extremity symptoms have significantly improved. I've asked him to stay on Plavix for another 6 months at least and we will follow him up at that time with repeat aortoiliac duplex and ABI. He is free for activity as tolerated at this time and can follow-up sooner if needed.      Peter Sandy MD [facility]

## 2017-05-11 NOTE — Addendum Note (Signed)
Addended by: Lianne Cure A on: 05/11/2017 04:47 PM   Modules accepted: Orders

## 2017-05-20 DIAGNOSIS — J029 Acute pharyngitis, unspecified: Secondary | ICD-10-CM | POA: Diagnosis not present

## 2017-06-14 DIAGNOSIS — E1121 Type 2 diabetes mellitus with diabetic nephropathy: Secondary | ICD-10-CM | POA: Diagnosis not present

## 2017-06-14 DIAGNOSIS — E1165 Type 2 diabetes mellitus with hyperglycemia: Secondary | ICD-10-CM | POA: Diagnosis not present

## 2017-06-14 DIAGNOSIS — E7800 Pure hypercholesterolemia, unspecified: Secondary | ICD-10-CM | POA: Diagnosis not present

## 2017-06-14 DIAGNOSIS — S39011A Strain of muscle, fascia and tendon of abdomen, initial encounter: Secondary | ICD-10-CM | POA: Diagnosis not present

## 2017-06-14 DIAGNOSIS — I10 Essential (primary) hypertension: Secondary | ICD-10-CM | POA: Diagnosis not present

## 2017-06-14 DIAGNOSIS — J069 Acute upper respiratory infection, unspecified: Secondary | ICD-10-CM | POA: Diagnosis not present

## 2017-07-15 ENCOUNTER — Other Ambulatory Visit: Payer: Self-pay | Admitting: Vascular Surgery

## 2017-07-17 ENCOUNTER — Other Ambulatory Visit: Payer: Self-pay | Admitting: Vascular Surgery

## 2017-10-03 DIAGNOSIS — B029 Zoster without complications: Secondary | ICD-10-CM | POA: Diagnosis not present

## 2017-10-04 DIAGNOSIS — B0230 Zoster ocular disease, unspecified: Secondary | ICD-10-CM | POA: Diagnosis not present

## 2017-10-11 ENCOUNTER — Other Ambulatory Visit: Payer: Self-pay | Admitting: Vascular Surgery

## 2017-10-13 ENCOUNTER — Encounter (HOSPITAL_COMMUNITY): Payer: Self-pay | Admitting: Emergency Medicine

## 2017-10-13 ENCOUNTER — Emergency Department (HOSPITAL_COMMUNITY): Payer: Medicare HMO

## 2017-10-13 DIAGNOSIS — J209 Acute bronchitis, unspecified: Secondary | ICD-10-CM | POA: Insufficient documentation

## 2017-10-13 DIAGNOSIS — I10 Essential (primary) hypertension: Secondary | ICD-10-CM | POA: Diagnosis not present

## 2017-10-13 DIAGNOSIS — Z79899 Other long term (current) drug therapy: Secondary | ICD-10-CM | POA: Insufficient documentation

## 2017-10-13 DIAGNOSIS — E119 Type 2 diabetes mellitus without complications: Secondary | ICD-10-CM | POA: Insufficient documentation

## 2017-10-13 DIAGNOSIS — E871 Hypo-osmolality and hyponatremia: Secondary | ICD-10-CM | POA: Insufficient documentation

## 2017-10-13 DIAGNOSIS — Z87891 Personal history of nicotine dependence: Secondary | ICD-10-CM | POA: Diagnosis not present

## 2017-10-13 DIAGNOSIS — R05 Cough: Secondary | ICD-10-CM | POA: Diagnosis not present

## 2017-10-13 DIAGNOSIS — Z794 Long term (current) use of insulin: Secondary | ICD-10-CM | POA: Insufficient documentation

## 2017-10-13 LAB — URINALYSIS, ROUTINE W REFLEX MICROSCOPIC
Bacteria, UA: NONE SEEN
Bilirubin Urine: NEGATIVE
GLUCOSE, UA: 150 mg/dL — AB
HGB URINE DIPSTICK: NEGATIVE
Ketones, ur: NEGATIVE mg/dL
Leukocytes, UA: NEGATIVE
NITRITE: NEGATIVE
PH: 5 (ref 5.0–8.0)
Protein, ur: 30 mg/dL — AB
SPECIFIC GRAVITY, URINE: 1.02 (ref 1.005–1.030)
Squamous Epithelial / LPF: NONE SEEN

## 2017-10-13 LAB — COMPREHENSIVE METABOLIC PANEL
ALT: 22 U/L (ref 17–63)
AST: 16 U/L (ref 15–41)
Albumin: 2.9 g/dL — ABNORMAL LOW (ref 3.5–5.0)
Alkaline Phosphatase: 76 U/L (ref 38–126)
Anion gap: 9 (ref 5–15)
BUN: 23 mg/dL — ABNORMAL HIGH (ref 6–20)
CHLORIDE: 97 mmol/L — AB (ref 101–111)
CO2: 26 mmol/L (ref 22–32)
Calcium: 8.7 mg/dL — ABNORMAL LOW (ref 8.9–10.3)
Creatinine, Ser: 1.19 mg/dL (ref 0.61–1.24)
GFR, EST NON AFRICAN AMERICAN: 60 mL/min — AB (ref >60)
Glucose, Bld: 158 mg/dL — ABNORMAL HIGH (ref 65–99)
POTASSIUM: 3.9 mmol/L (ref 3.5–5.1)
Sodium: 132 mmol/L — ABNORMAL LOW (ref 135–145)
Total Bilirubin: 0.9 mg/dL (ref 0.3–1.2)
Total Protein: 6.8 g/dL (ref 6.5–8.1)

## 2017-10-13 LAB — CBC WITH DIFFERENTIAL/PLATELET
Basophils Absolute: 0 10*3/uL (ref 0.0–0.1)
Basophils Relative: 0 %
EOS PCT: 0 %
Eosinophils Absolute: 0 10*3/uL (ref 0.0–0.7)
HCT: 41 % (ref 39.0–52.0)
Hemoglobin: 13.7 g/dL (ref 13.0–17.0)
LYMPHS ABS: 0.9 10*3/uL (ref 0.7–4.0)
LYMPHS PCT: 6 %
MCH: 27.9 pg (ref 26.0–34.0)
MCHC: 33.4 g/dL (ref 30.0–36.0)
MCV: 83.5 fL (ref 78.0–100.0)
MONO ABS: 1.5 10*3/uL — AB (ref 0.1–1.0)
MONOS PCT: 9 %
Neutro Abs: 13.8 10*3/uL — ABNORMAL HIGH (ref 1.7–7.7)
Neutrophils Relative %: 85 %
PLATELETS: 231 10*3/uL (ref 150–400)
RBC: 4.91 MIL/uL (ref 4.22–5.81)
RDW: 14.4 % (ref 11.5–15.5)
WBC: 16.4 10*3/uL — ABNORMAL HIGH (ref 4.0–10.5)

## 2017-10-13 LAB — CBG MONITORING, ED: Glucose-Capillary: 162 mg/dL — ABNORMAL HIGH (ref 65–99)

## 2017-10-13 NOTE — ED Triage Notes (Signed)
Patient reports elevated blood sugar levels for several days with productive cough / nasal congestion , pt. added that he has been treated with Valtrex and Prednisone for shingles . No fever /occasional chills .

## 2017-10-14 ENCOUNTER — Emergency Department (HOSPITAL_COMMUNITY)
Admission: EM | Admit: 2017-10-14 | Discharge: 2017-10-14 | Disposition: A | Discharge status: Home / Self Care | Payer: Medicare HMO | Attending: Emergency Medicine | Admitting: Emergency Medicine

## 2017-10-14 DIAGNOSIS — I10 Essential (primary) hypertension: Secondary | ICD-10-CM | POA: Diagnosis not present

## 2017-10-14 DIAGNOSIS — J209 Acute bronchitis, unspecified: Secondary | ICD-10-CM | POA: Diagnosis not present

## 2017-10-14 DIAGNOSIS — Z794 Long term (current) use of insulin: Secondary | ICD-10-CM | POA: Diagnosis not present

## 2017-10-14 DIAGNOSIS — Z79899 Other long term (current) drug therapy: Secondary | ICD-10-CM | POA: Diagnosis not present

## 2017-10-14 DIAGNOSIS — E119 Type 2 diabetes mellitus without complications: Secondary | ICD-10-CM | POA: Diagnosis not present

## 2017-10-14 DIAGNOSIS — Z87891 Personal history of nicotine dependence: Secondary | ICD-10-CM | POA: Diagnosis not present

## 2017-10-14 DIAGNOSIS — E871 Hypo-osmolality and hyponatremia: Secondary | ICD-10-CM | POA: Diagnosis not present

## 2017-10-14 MED ORDER — ALBUTEROL SULFATE HFA 108 (90 BASE) MCG/ACT IN AERS: 2.0000 | INHALATION_SPRAY | RESPIRATORY_TRACT | 0 refills | Status: DC | PRN

## 2017-10-14 MED ORDER — IPRATROPIUM-ALBUTEROL 0.5-2.5 (3) MG/3ML IN SOLN
3.0000 mL | Freq: Once | RESPIRATORY_TRACT | Status: AC
  Administered 2017-10-14: 3 mL via RESPIRATORY_TRACT
  Filled 2017-10-14: qty 3

## 2017-10-14 NOTE — Discharge Instructions (Signed)
Use the inhaler - two puffs every four hours as needed for cough or difficulty breathing. °

## 2017-10-14 NOTE — ED Provider Notes (Signed)
[facility] EMERGENCY DEPARTMENT Provider Note   CSN: [id number] Arrival date & time: 10/13/17  1918     History   Chief Complaint Chief Complaint  Patient presents with  . Hyperglycemia  . Cough    Nasal Congestion     HPI Peter Walker is a 72 y.o. male.  The history is provided by the patient.  He has history of diabetes, hypertension, peripheral artery disease, iliac artery aneurysm and comes in with 3-day history of cough productive of light yellow sputum and dyspnea.  Cough is triggered when he tries to take a deep breath.  This has been associated with generalized body aches, nausea, and anorexia.  He has not had any fever or sweats, but he did develop chills today.  He has just completed a 10-day course of valacyclovir and prednisone for shingles on the left side of his forehead.  During this time, his blood glucose had been elevated to over 300 the entire time.  His wife has had a similar illness and they suspect that they were exposed to someone at the same time.  He is a former smoker.  Past Medical History:  Diagnosis Date  . Aneurysm, common iliac artery ([place]) 02/23/2001   no hx of CIA aneurysm seen 09/17/16, but has PAD with iliac occlusive disease; Left  and Severe claudication Left leg  secondary to  iliac occlusive  disease 03/06/2004  . Degenerative joint disease   . Diabetes mellitus    Type II  . Hypertension   . PAD (peripheral artery disease) (HCC)    left CIA stents '02, '05, right CIA stent '03    Patient Active Problem List   Diagnosis Date Noted  . Spinal stenosis of lumbar region with neurogenic claudication 09/08/2016  . PAD (peripheral artery disease) ([place]) 12/10/2015  . Peripheral vascular disease, unspecified ([place]) 07/27/2012  . Atherosclerosis of native arteries of the extremities with intermittent claudication 01/26/2012    Past Surgical History:  Procedure Laterality Date  . ANGIOPLASTY     common iliac artery L '02, '05;  R '03  . LOWER EXTREMITY ANGIOGRAPHY  03/25/2017   Procedure: Lower Extremity Angiography;  Surgeon: Waynetta Sandy, MD;  Location: [facility];  Service: Cardiovascular;;  . PERIPHERAL VASCULAR INTERVENTION Right 03/25/2017   Procedure: Peripheral Vascular Intervention;  Surgeon: Waynetta Sandy, MD;  Location: [facility];  Service: Cardiovascular;  Laterality: Right;  common iliac       Home Medications    Prior to Admission medications   Medication Sig Start Date End Date Taking? Authorizing Provider  amLODipine (NORVASC) 5 MG tablet Take 5 mg by mouth daily.    [provider]  atorvastatin (LIPITOR) 20 MG tablet Take 20 mg by mouth daily. Reported on 12/10/2015    [provider]  Bioflavonoid Products (VITAMIN C) CHEW Chew 1 tablet by mouth daily.    [provider]  celecoxib (CELEBREX) 200 MG capsule Take 200 mg by mouth 2 (two) times daily as needed for mild pain.  09/02/16   [provider]  clopidogrel (PLAVIX) 75 MG tablet TAKE 1 TABLET BY MOUTH EVERY DAY 10/11/17   Waynetta Sandy, MD  glucosamine-chondroitin 500-400 MG tablet Take 1 tablet by mouth daily.     [provider]  HYDROcodone-acetaminophen (NORCO/VICODIN) 5-325 MG tablet Take 1 tablet by mouth every 6 (six) hours as needed for moderate pain.  07/24/16   [provider]  insulin aspart  protamine-insulin aspart (NOVOLOG MIX 70/30) (70-30) 100 UNIT/ML injection Inject 10-70 Units into the skin 3 (three) times daily with meals. 70 units the AM, 10 Lunch, 70 in PM    [provider]  JANUVIA 100 MG tablet Take 100 mg by mouth daily.  08/30/16   [provider]  losartan-hydrochlorothiazide (HYZAAR) 100-12.5 MG tablet Take 1 tablet by mouth daily.  08/30/16   [provider]  MELATONIN PO Take 1 tablet by mouth at bedtime as needed (sleep).    [provider]  Multiple Vitamin (MULTI-VITAMIN  PO) Take 1 tablet by mouth daily.    [provider]  NOVOLOG MIX 70/30 FLEXPEN (70-30) 100 UNIT/ML FlexPen  02/28/17   [provider]  [place] 1ML/31G 31G X 5/16" 1 ML MISC  05/01/17   [provider]    Family History Family History  Problem Relation Age of Onset  . Diabetes Mother   . Heart disease Mother   . Diabetes Father   . Hyperlipidemia Father   . Hypertension Father   . Heart disease Father     Social History Social History   Tobacco Use  . Smoking status: Former Smoker    Packs/day: 2.00    Years: 37.00    Pack years: 74.00    Types: Cigarettes    Last attempt to quit: 03/12/2004    Years since quitting: 13.6  . Smokeless tobacco: Never Used  Substance Use Topics  . Alcohol use: No    Alcohol/week: 0.0 oz  . Drug use: No     Allergies   No known allergies   Review of Systems Review of Systems  All other systems reviewed and are negative.    Physical Exam Updated Vital Signs BP (!) 136/7   Pulse 83   Resp 15   SpO2 95%   Physical Exam  Nursing note and vitals reviewed.  72 year old male, resting comfortably and in no acute distress. Vital signs are normal. Oxygen saturation is 95%, which is normal. Head is normocephalic and atraumatic. PERRLA, EOMI. Oropharynx is clear.  Scabbed over vesicles present on the left side of forehead consistent with resolving herpes zoster. Neck is nontender and supple without adenopathy or JVD. Back is nontender and there is no CVA tenderness. Lungs have coarse expiratory rhonchi and prolonged exhalation phase.  There are no rales or wheezes. Chest is nontender. Heart has regular rate and rhythm without murmur. Abdomen is soft, flat, nontender without masses or hepatosplenomegaly and peristalsis is normoactive. Extremities have no cyanosis or edema, full range of motion is present. Skin is warm and dry without other rash. Neurologic: Mental status is normal, cranial nerves  are intact, there are no motor or sensory deficits.  ED Treatments / Results  Labs (all labs ordered are listed, but only abnormal results are displayed) Labs Reviewed  CBC WITH DIFFERENTIAL/PLATELET - Abnormal; Notable for the following components:      Result Value   WBC 16.4 (*)    Neutro Abs 13.8 (*)    Monocytes Absolute 1.5 (*)    All other components within normal limits  COMPREHENSIVE METABOLIC PANEL - Abnormal; Notable for the following components:   Sodium 132 (*)    Chloride 97 (*)    Glucose, Bld 158 (*)    BUN 23 (*)    Calcium 8.7 (*)    Albumin 2.9 (*)    GFR calc non Af Amer 60 (*)    All other components  within normal limits  URINALYSIS, ROUTINE W REFLEX MICROSCOPIC - Abnormal; Notable for the following components:   Glucose, UA 150 (*)    Protein, ur 30 (*)    All other components within normal limits  CBG MONITORING, ED - Abnormal; Notable for the following components:   Glucose-Capillary 162 (*)    All other components within normal limits   Radiology Dg Chest 2 View  Result Date: 10/13/2017 CLINICAL DATA:  Persistent productive cough, chest congestion, hyperglycemia, productive of tan mucus, nasal congestion, symptoms for 1 week EXAM: CHEST  2 VIEW COMPARISON:  02/02/2007 FINDINGS: Normal heart size, mediastinal contours, and pulmonary vascularity. Atherosclerotic calcification aorta. Bronchitic changes without infiltrate, pleural effusion or pneumothorax. Calcified granuloma RIGHT mid lung stable. Chronic elevation of LEFT diaphragm. Degenerative disc disease changes thoracic spine. IMPRESSION: Chronic bronchitic and old granulomatous disease changes without acute infiltrate. Electronically Signed   By: Lavonia Dana M.D.   On: 10/13/2017 21:12    Procedures Procedures (including critical care time)  Medications Ordered in ED Medications  ipratropium-albuterol (DUONEB) 0.5-2.5 (3) MG/3ML nebulizer solution 3 mL (3 mLs Nebulization Given 10/14/17 0223)    ipratropium-albuterol (DUONEB) 0.5-2.5 (3) MG/3ML nebulizer solution 3 mL (3 mLs Nebulization Given 10/14/17 0332)     Initial Impression / Assessment and Plan / ED Course  I have reviewed the triage vital signs and the nursing notes.  Pertinent labs & imaging results that were available during my care of the patient were reviewed by me and considered in my medical decision making (see chart for details).  Respiratory tract infection-likely influenza.  Chest x-ray shows no evidence of infiltrate.  WBC is modestly elevated, but likely related to course of steroids that he has just completed.  He is nontoxic in appearance.  Glucose is only minimally elevated at 158.  He is outside the window to use antiviral treatment for possible influenza, so flu testing is not done.  He is given a therapeutic trial of albuterol with ipratropium.  Old records are reviewed, and he has no relevant past visits.  He had slight improvement with initial nebulizer treatment.  This was repeated with further improvement.  On exam, there seemed to be improved airflow and no longer whether any rhonchi.  Because he has just completed a course of prednisone, the decision is made to not give additional prednisone.  He is discharged with prescription for albuterol inhaler and instructed to follow-up with PCP.  Return precautions discussed.  Final Clinical Impressions(s) / ED Diagnoses   Final diagnoses:  Acute bronchitis, unspecified organism  Hyponatremia    ED Discharge Orders        Ordered    albuterol (PROVENTIL HFA;VENTOLIN HFA) 108 (90 Base) MCG/ACT inhaler  Every 4 hours PRN     54/65/03 5465       Delora Fuel, MD 68/[birth year]/75 [phone]

## 2017-10-15 DIAGNOSIS — B0230 Zoster ocular disease, unspecified: Secondary | ICD-10-CM | POA: Diagnosis not present

## 2017-10-20 DIAGNOSIS — E1165 Type 2 diabetes mellitus with hyperglycemia: Secondary | ICD-10-CM | POA: Diagnosis not present

## 2017-11-09 DIAGNOSIS — H6121 Impacted cerumen, right ear: Secondary | ICD-10-CM | POA: Diagnosis not present

## 2017-11-09 DIAGNOSIS — H6522 Chronic serous otitis media, left ear: Secondary | ICD-10-CM | POA: Diagnosis not present

## 2017-11-09 DIAGNOSIS — H6982 Other specified disorders of Eustachian tube, left ear: Secondary | ICD-10-CM | POA: Diagnosis not present

## 2017-11-09 DIAGNOSIS — H9072 Mixed conductive and sensorineural hearing loss, unilateral, left ear, with unrestricted hearing on the contralateral side: Secondary | ICD-10-CM | POA: Diagnosis not present

## 2017-11-10 ENCOUNTER — Other Ambulatory Visit: Payer: Self-pay | Admitting: Vascular Surgery

## 2017-11-13 ENCOUNTER — Other Ambulatory Visit: Payer: Self-pay | Admitting: Vascular Surgery

## 2017-11-15 DIAGNOSIS — C44319 Basal cell carcinoma of skin of other parts of face: Secondary | ICD-10-CM | POA: Diagnosis not present

## 2017-11-15 DIAGNOSIS — B0229 Other postherpetic nervous system involvement: Secondary | ICD-10-CM | POA: Diagnosis not present

## 2017-11-15 DIAGNOSIS — D485 Neoplasm of uncertain behavior of skin: Secondary | ICD-10-CM | POA: Diagnosis not present

## 2017-11-15 DIAGNOSIS — C4441 Basal cell carcinoma of skin of scalp and neck: Secondary | ICD-10-CM | POA: Diagnosis not present

## 2017-12-03 ENCOUNTER — Ambulatory Visit (INDEPENDENT_AMBULATORY_CARE_PROVIDER_SITE_OTHER)
Admission: RE | Admit: 2017-12-03 | Discharge: 2017-12-03 | Disposition: A | Discharge status: Home / Self Care | Payer: Medicare HMO | Source: Ambulatory Visit | Attending: Vascular Surgery | Admitting: Vascular Surgery

## 2017-12-03 ENCOUNTER — Other Ambulatory Visit: Payer: Self-pay

## 2017-12-03 ENCOUNTER — Ambulatory Visit: Payer: Medicare HMO | Admitting: Vascular Surgery

## 2017-12-03 ENCOUNTER — Encounter: Payer: Self-pay | Admitting: Vascular Surgery

## 2017-12-03 ENCOUNTER — Ambulatory Visit (HOSPITAL_COMMUNITY)
Admission: RE | Admit: 2017-12-03 | Discharge: 2017-12-03 | Disposition: A | Discharge status: Home / Self Care | Payer: Medicare HMO | Source: Ambulatory Visit | Attending: Vascular Surgery | Admitting: Vascular Surgery

## 2017-12-03 VITALS — BP 151/74 | HR 66 | Temp 97.6°F | Resp 18 | Ht 71.0 in | Wt 221.0 lb

## 2017-12-03 DIAGNOSIS — Z95828 Presence of other vascular implants and grafts: Secondary | ICD-10-CM

## 2017-12-03 DIAGNOSIS — I739 Peripheral vascular disease, unspecified: Secondary | ICD-10-CM

## 2017-12-03 DIAGNOSIS — R609 Edema, unspecified: Secondary | ICD-10-CM

## 2017-12-03 DIAGNOSIS — I708 Atherosclerosis of other arteries: Secondary | ICD-10-CM | POA: Insufficient documentation

## 2017-12-03 HISTORY — PX: TYMPANOSTOMY TUBE PLACEMENT: SHX32

## 2017-12-03 NOTE — Progress Notes (Signed)
Vitals:   12/03/17 0859  BP: (!) 152/71  Pulse: 61  Resp: 18  Temp: 97.6 F (36.4 C)  TempSrc: Oral  Weight: 221 lb (100.2 kg)  Height: 5\' 11"  (1.803 m)  HC: 98" (248.9 cm)

## 2017-12-03 NOTE — Progress Notes (Signed)
Patient ID: Peter Walker, male   DOB: 1946/01/16, 72 y.o.   MRN: [id number]  Reason for Consult: PAD (f/u)   Referred by Jonathon Jordan, MD  Subjective:     HPI:  Peter Walker is a 71 y.o. male with a history of left common iliac artery stenting and then had right lower extremity pain.  He also has spine issues.  I recently stented his right common iliac artery back in June of last year.  He now states that his right lower extremity pain is 100% better but he does have some back pain radiating into his right buttocks.  He continues to bowl regularly.  He does not walk on a dedicated walking program but states that he can walk much further now.  He does not have tissue loss or ulceration.  He continues on his Plavix.  Past Medical History:  Diagnosis Date  . Aneurysm, common iliac artery ([place]) 02/23/2001   no hx of CIA aneurysm seen 09/17/16, but has PAD with iliac occlusive disease; Left  and Severe claudication Left leg  secondary to  iliac occlusive  disease 03/06/2004  . Degenerative joint disease   . Diabetes mellitus    Type II  . Hypertension   . PAD (peripheral artery disease) (HCC)    left CIA stents '02, '05, right CIA stent '03   Family History  Problem Relation Age of Onset  . Diabetes Mother   . Heart disease Mother   . Diabetes Father   . Hyperlipidemia Father   . Hypertension Father   . Heart disease Father    Past Surgical History:  Procedure Laterality Date  . ANGIOPLASTY     common iliac artery L '02, '05; R '03  . LOWER EXTREMITY ANGIOGRAPHY  03/25/2017   Procedure: Lower Extremity Angiography;  Surgeon: Waynetta Sandy, MD;  Location: [facility];  Service: Cardiovascular;;  . PERIPHERAL VASCULAR INTERVENTION Right 03/25/2017   Procedure: Peripheral Vascular Intervention;  Surgeon: Waynetta Sandy, MD;  Location: [facility];  Service: Cardiovascular;  Laterality: Right;  common iliac    Short Social History:  Social  History   Tobacco Use  . Smoking status: Former Smoker    Packs/day: 2.00    Years: 37.00    Pack years: 74.00    Types: Cigarettes    Last attempt to quit: 03/12/2004    Years since quitting: 13.7  . Smokeless tobacco: Never Used  Substance Use Topics  . Alcohol use: No    Alcohol/week: 0.0 oz    Allergies  Allergen Reactions  . No Known Allergies     Current Outpatient Medications  Medication Sig Dispense Refill  . amLODipine (NORVASC) 5 MG tablet Take 5 mg by mouth daily.    Marland Kitchen atorvastatin (LIPITOR) 20 MG tablet Take 20 mg by mouth daily. Reported on 12/10/2015    . celecoxib (CELEBREX) 200 MG capsule Take 200 mg by mouth 2 (two) times daily as needed for mild pain.     Marland Kitchen HYDROcodone-acetaminophen (NORCO/VICODIN) 5-325 MG tablet Take 1 tablet by mouth every 6 (six) hours as needed for moderate pain.     Marland Kitchen insulin aspart protamine-insulin aspart (NOVOLOG MIX 70/30) (70-30) 100 UNIT/ML injection Inject 10-70 Units into the skin See admin instructions. 50 units every morning, 10 units at Lunch and 50 units every evening    . JANUVIA 100 MG tablet Take 100 mg by mouth daily.     Marland Kitchen losartan-hydrochlorothiazide (HYZAAR) 100-12.5  MG tablet Take 1 tablet by mouth daily.     . Promethazine-Codeine 6.25-10 MG/5ML SOLN TK 5 ML PO Q 4-6 H PRN  0  . RELION INSULIN SYRINGE 1ML/31G 31G X 5/16" 1 ML MISC     . albuterol (PROVENTIL HFA;VENTOLIN HFA) 108 (90 Base) MCG/ACT inhaler Inhale 2 puffs into the lungs every 4 (four) hours as needed for wheezing or shortness of breath (or coughing). (Patient not taking: Reported on 12/03/2017) 1 Inhaler 0  . azithromycin (ZITHROMAX) 250 MG tablet   0  . clopidogrel (PLAVIX) 75 MG tablet TAKE 1 TABLET BY MOUTH EVERY DAY 90 tablet 0  . rosuvastatin (CRESTOR) 20 MG tablet TK 1 T PO QD  3   No current facility-administered medications for this visit.     Review of Systems  Constitutional:  Constitutional negative. HENT: HENT negative.  Eyes: Eyes negative.    Respiratory: Respiratory negative.  GI: Gastrointestinal negative.  Musculoskeletal: Positive for back pain.  Skin: Skin negative.  Neurological: Neurological negative. Hematologic: Hematologic/lymphatic negative.        Objective:  Objective   Vitals:   12/03/17 0859 12/03/17 0903  BP: (!) 152/71 (!) 151/74  Pulse: 61 66  Resp: 18   Temp: 97.6 F (36.4 C)   TempSrc: Oral   Weight: 221 lb (100.2 kg)   Height: 5\' 11"  (1.803 m)   HC: 98" (248.9 cm)    Body mass index is 30.82 kg/m.  Physical Exam  Constitutional: He appears well-developed.  HENT:  Head: Normocephalic.  Eyes: Pupils are equal, round, and reactive to light.  Neck: Normal range of motion.  Cardiovascular: Normal rate.  Pulses:      Femoral pulses are 1+ on the right side, and 2+ on the left side.      Posterior tibial pulses are 2+ on the left side.  Pulmonary/Chest: Effort normal.  Abdominal: Soft. He exhibits no mass.  Musculoskeletal: Normal range of motion. He exhibits no edema.  Neurological: He is alert.  Skin: Skin is warm and dry.  Psychiatric: He has a normal mood and affect. His behavior is normal. Judgment and thought content normal.    Data: I have independently interpreted his right common iliac artery stent to have 50-99% stenosis with a velocity of 411 cm/s.  Left side is biphasic throughout with a peak systolic velocity of 440 cm/s.  ABI on the right is 0.92 and the left is 1.06.  His toe pressures are 0.56 on the right and 0.8 on the left.     Assessment/Plan:     72 year old male who now has bilateral common iliac artery stenting.  There appears to be a stenosis in the right side but he is free of symptoms and his ABI remains near 1.  He is no longer a smoker and continues on Plavix.  At this time I would hold the course and he can follow-up in 6 months with repeat study should he not have any further issues.  If he have any worsening velocities that time we will need to consider  re-intervention on the right common iliac artery stent.     Waynetta Sandy MD [facility]

## 2017-12-07 DIAGNOSIS — H6522 Chronic serous otitis media, left ear: Secondary | ICD-10-CM | POA: Diagnosis not present

## 2017-12-07 DIAGNOSIS — J310 Chronic rhinitis: Secondary | ICD-10-CM | POA: Diagnosis not present

## 2017-12-07 DIAGNOSIS — H9012 Conductive hearing loss, unilateral, left ear, with unrestricted hearing on the contralateral side: Secondary | ICD-10-CM | POA: Diagnosis not present

## 2017-12-07 DIAGNOSIS — H6982 Other specified disorders of Eustachian tube, left ear: Secondary | ICD-10-CM | POA: Diagnosis not present

## 2017-12-07 DIAGNOSIS — J343 Hypertrophy of nasal turbinates: Secondary | ICD-10-CM | POA: Diagnosis not present

## 2017-12-07 NOTE — Addendum Note (Signed)
Addended by: Lianne Cure A on: 12/07/2017 03:43 PM   Modules accepted: Orders

## 2017-12-17 DIAGNOSIS — H6982 Other specified disorders of Eustachian tube, left ear: Secondary | ICD-10-CM | POA: Diagnosis not present

## 2017-12-17 DIAGNOSIS — H9012 Conductive hearing loss, unilateral, left ear, with unrestricted hearing on the contralateral side: Secondary | ICD-10-CM | POA: Diagnosis not present

## 2017-12-17 DIAGNOSIS — H6522 Chronic serous otitis media, left ear: Secondary | ICD-10-CM | POA: Diagnosis not present

## 2018-01-13 DIAGNOSIS — C44319 Basal cell carcinoma of skin of other parts of face: Secondary | ICD-10-CM | POA: Diagnosis not present

## 2018-03-30 ENCOUNTER — Ambulatory Visit: Payer: Medicare HMO | Admitting: Podiatry

## 2018-03-30 ENCOUNTER — Encounter: Payer: Self-pay | Admitting: Podiatry

## 2018-03-30 DIAGNOSIS — M795 Residual foreign body in soft tissue: Secondary | ICD-10-CM

## 2018-04-03 NOTE — Progress Notes (Signed)
   HPI: 72 year old male with PMHx of T2DM presenting today as a new patient with a chief complaint of an abscess to the plantar aspect of the right foot. Patient states he believes he was bitten by a tick two weeks ago prior to the abscess appearing. He has not done anything to treat the symptoms. Touching or walking increases the pain. Patient is here for further evaluation and treatment.   Past Medical History:  Diagnosis Date  . Aneurysm, common iliac artery ([place]) 02/23/2001   no hx of CIA aneurysm seen 09/17/16, but has PAD with iliac occlusive disease; Left  and Severe claudication Left leg  secondary to  iliac occlusive  disease 03/06/2004  . Degenerative joint disease   . Diabetes mellitus    Type II  . Hypertension   . PAD (peripheral artery disease) (HCC)    left CIA stents '02, '05, right CIA stent '03     Physical Exam: General: The patient is alert and oriented x3 in no acute distress.  Dermatology: Localized small, purulent abscess noted to the plantar forefoot. Skin is warm, dry and supple bilateral lower extremities.   Vascular: Palpable pedal pulses bilaterally. No edema or erythema noted. Capillary refill within normal limits.  Neurological: Epicritic and protective threshold grossly intact bilaterally.   Musculoskeletal Exam: Range of motion within normal limits to all pedal and ankle joints bilateral. Muscle strength 5/5 in all groups bilateral.    Assessment: 1. Foreign body abscess right plantar forefoot   Plan of Care:  1. Patient evaluated.  2. Small splinter removed and abscess drained with tissue nipper.  3. Recommended antibiotic ointment daily with a bandage for one week.  4. Return to clinic as needed.     Edrick Kins, DPM [facility]  Dr. Edrick Kins, DPM    [address], [place] [address]                Office [phone]  Fax [phone]

## 2018-04-06 ENCOUNTER — Ambulatory Visit: Payer: Medicare HMO | Admitting: Podiatry

## 2018-04-29 DIAGNOSIS — E1122 Type 2 diabetes mellitus with diabetic chronic kidney disease: Secondary | ICD-10-CM | POA: Diagnosis not present

## 2018-04-29 DIAGNOSIS — N182 Chronic kidney disease, stage 2 (mild): Secondary | ICD-10-CM | POA: Diagnosis not present

## 2018-04-29 DIAGNOSIS — R69 Illness, unspecified: Secondary | ICD-10-CM | POA: Diagnosis not present

## 2018-04-29 DIAGNOSIS — G894 Chronic pain syndrome: Secondary | ICD-10-CM | POA: Diagnosis not present

## 2018-04-29 DIAGNOSIS — I10 Essential (primary) hypertension: Secondary | ICD-10-CM | POA: Diagnosis not present

## 2018-04-29 DIAGNOSIS — I739 Peripheral vascular disease, unspecified: Secondary | ICD-10-CM | POA: Diagnosis not present

## 2018-04-29 DIAGNOSIS — E1165 Type 2 diabetes mellitus with hyperglycemia: Secondary | ICD-10-CM | POA: Diagnosis not present

## 2018-05-09 ENCOUNTER — Other Ambulatory Visit: Payer: Self-pay | Admitting: Vascular Surgery

## 2018-05-13 ENCOUNTER — Other Ambulatory Visit: Payer: Self-pay

## 2018-05-13 MED ORDER — CLOPIDOGREL BISULFATE 75 MG PO TABS: 75.0000 mg | ORAL_TABLET | Freq: Every day | ORAL | 3 refills | Status: DC

## 2018-05-31 ENCOUNTER — Other Ambulatory Visit: Payer: Self-pay | Admitting: Vascular Surgery

## 2018-06-10 ENCOUNTER — Encounter: Payer: Self-pay | Admitting: Vascular Surgery

## 2018-06-10 ENCOUNTER — Ambulatory Visit (INDEPENDENT_AMBULATORY_CARE_PROVIDER_SITE_OTHER): Payer: Medicare HMO | Admitting: Vascular Surgery

## 2018-06-10 ENCOUNTER — Other Ambulatory Visit: Payer: Self-pay

## 2018-06-10 ENCOUNTER — Ambulatory Visit (HOSPITAL_COMMUNITY)
Admission: RE | Admit: 2018-06-10 | Discharge: 2018-06-10 | Disposition: A | Discharge status: Home / Self Care | Payer: Medicare HMO | Source: Ambulatory Visit | Attending: Vascular Surgery | Admitting: Vascular Surgery

## 2018-06-10 ENCOUNTER — Ambulatory Visit (INDEPENDENT_AMBULATORY_CARE_PROVIDER_SITE_OTHER)
Admission: RE | Admit: 2018-06-10 | Discharge: 2018-06-10 | Disposition: A | Discharge status: Home / Self Care | Payer: Medicare HMO | Source: Ambulatory Visit | Attending: Vascular Surgery | Admitting: Vascular Surgery

## 2018-06-10 VITALS — BP 167/69 | HR 50 | Temp 97.2°F | Resp 16 | Ht 71.0 in | Wt 224.0 lb

## 2018-06-10 DIAGNOSIS — T82855A Stenosis of coronary artery stent, initial encounter: Secondary | ICD-10-CM | POA: Diagnosis not present

## 2018-06-10 DIAGNOSIS — Z95828 Presence of other vascular implants and grafts: Secondary | ICD-10-CM | POA: Diagnosis not present

## 2018-06-10 DIAGNOSIS — I739 Peripheral vascular disease, unspecified: Secondary | ICD-10-CM

## 2018-06-10 DIAGNOSIS — X58XXXA Exposure to other specified factors, initial encounter: Secondary | ICD-10-CM | POA: Insufficient documentation

## 2018-06-10 MED ORDER — CLOPIDOGREL BISULFATE 75 MG PO TABS: 75.0000 mg | ORAL_TABLET | Freq: Every day | ORAL | 4 refills | Status: DC

## 2018-06-10 NOTE — Progress Notes (Signed)
Patient ID: Peter Walker, male   DOB: 1946/07/01, 72 y.o.   MRN: [id number]  Reason for Consult: PAD (6 mth f/u Bilat Aorto-Iliac, ABI. )   Referred by Jonathon Jordan, MD  Subjective:     HPI:  Peter Walker is a 72 y.o. male with history of claudication bilateral common iliac artery stenting.  At last visit he had elevated velocities but ABIs were stable.  He is walking and bowling without limitation.  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  He does not have any tissue loss or wounds.  He in fact had a wound on his foot was seen by Dr. Amalia Hailey and podiatry but healed this without issue.  No complaints related to today's visit.  He is accompanied by his wife and had duplexes of his aortoiliac segments with along with ABIs prior to today's visit.  He remains on Plavix and wishes to have this refilled today.  Past Medical History:  Diagnosis Date  . Aneurysm, common iliac artery ([place]) 02/23/2001   no hx of CIA aneurysm seen 09/17/16, but has PAD with iliac occlusive disease; Left  and Severe claudication Left leg  secondary to  iliac occlusive  disease 03/06/2004  . Degenerative joint disease   . Diabetes mellitus    Type II  . Hypertension   . PAD (peripheral artery disease) (HCC)    left CIA stents '02, '05, right CIA stent '03   Family History  Problem Relation Age of Onset  . Diabetes Mother   . Heart disease Mother   . Diabetes Father   . Hyperlipidemia Father   . Hypertension Father   . Heart disease Father    Past Surgical History:  Procedure Laterality Date  . ANGIOPLASTY     common iliac artery L '02, '05; R '03  . LOWER EXTREMITY ANGIOGRAPHY  03/25/2017   Procedure: Lower Extremity Angiography;  Surgeon: Waynetta Sandy, MD;  Location: [facility];  Service: Cardiovascular;;  . PERIPHERAL VASCULAR INTERVENTION Right 03/25/2017   Procedure: Peripheral Vascular Intervention;  Surgeon: Waynetta Sandy, MD;  Location: [facility];  Service: Cardiovascular;  Laterality: Right;  common iliac    Short Social History:  Social History   Tobacco Use  . Smoking status: Former Smoker    Packs/day: 2.00    Years: 37.00    Pack years: 74.00    Types: Cigarettes    Last attempt to quit: 03/12/2004    Years since quitting: 14.2  . Smokeless tobacco: Never Used  Substance Use Topics  . Alcohol use: No    Alcohol/week: 0.0 standard drinks    Allergies  Allergen Reactions  . No Known Allergies     Current Outpatient Medications  Medication Sig Dispense Refill  . albuterol (PROVENTIL HFA;VENTOLIN HFA) 108 (90 Base) MCG/ACT inhaler Inhale 2 puffs into the lungs every 4 (four) hours as needed for wheezing or shortness of breath (or coughing). 1 Inhaler 0  . amLODipine (NORVASC) 5 MG tablet Take 5 mg by mouth daily.    Marland Kitchen atorvastatin (LIPITOR) 20 MG tablet Take 20 mg by mouth daily. Reported on 12/10/2015    . azithromycin (ZITHROMAX) 250 MG tablet   0  . celecoxib (CELEBREX) 200 MG capsule Take 200 mg by mouth 2 (two) times daily as needed for mild pain.     Marland Kitchen clopidogrel (PLAVIX) 75 MG tablet TAKE 1 TABLET BY MOUTH DAILY 90 tablet 0  . clopidogrel (  PLAVIX) 75 MG tablet Take 1 tablet (75 mg total) by mouth daily. 90 tablet 3  . clopidogrel (PLAVIX) 75 MG tablet TAKE 1 TABLET BY MOUTH DAILY 30 tablet 0  . HYDROcodone-acetaminophen (NORCO/VICODIN) 5-325 MG tablet Take 1 tablet by mouth every 6 (six) hours as needed for moderate pain.     Marland Kitchen insulin aspart protamine-insulin aspart (NOVOLOG MIX 70/30) (70-30) 100 UNIT/ML injection Inject 10-70 Units into the skin See admin instructions. 50 units every morning, 10 units at Lunch and 50 units every evening    . JANUVIA 100 MG tablet Take 100 mg by mouth daily.     Marland Kitchen losartan-hydrochlorothiazide (HYZAAR) 100-12.5 MG tablet Take 1 tablet by mouth daily.     . Promethazine-Codeine 6.25-10 MG/5ML SOLN TK 5 ML PO Q 4-6 H PRN  0  . RELION INSULIN SYRINGE 1ML/31G 31G X  5/16" 1 ML MISC     . rosuvastatin (CRESTOR) 20 MG tablet TK 1 T PO QD  3   No current facility-administered medications for this visit.     Review of Systems  Constitutional:  Constitutional negative. HENT: HENT negative.  Respiratory: Respiratory negative.  Cardiovascular: Cardiovascular negative.  GI: Gastrointestinal negative.  Musculoskeletal: Musculoskeletal negative.  Skin: Skin negative.  Neurological: Neurological negative. Hematologic: Hematologic/lymphatic negative.  Psychiatric: Psychiatric negative.        Objective:  Objective   Vitals:   06/10/18 0932  BP: (!) 167/69  Pulse: (!) 50  Resp: 16  Temp: (!) 97.2 F (36.2 C)  TempSrc: Oral  SpO2: 99%  Weight: 224 lb (101.6 kg)  Height: 5\' 11"  (1.803 m)   Body mass index is 31.24 kg/m.  Physical Exam  Constitutional: He is oriented to person, place, and time. He appears well-developed.  HENT:  Head: Normocephalic.  Eyes: Pupils are equal, round, and reactive to light.  Neck: Normal range of motion. Neck supple.  Cardiovascular: Normal rate.  Pulses:      Radial pulses are 2+ on the right side, and 2+ on the left side.       Femoral pulses are 2+ on the right side, and 2+ on the left side. Pulmonary/Chest: Effort normal.  Abdominal: Soft. He exhibits no mass.  Musculoskeletal: He exhibits no edema.  Neurological: He is alert and oriented to person, place, and time.  Skin: Skin is dry. Capillary refill takes less than 2 seconds.  Psychiatric: He has a normal mood and affect. His behavior is normal. Judgment and thought content normal.    Data: I have independently interpreted his aortoiliac duplex which demonstrates 50 to 99% stenosis in his right stent with peak velocity of 474 in the left stent similar stenosis with peak velocity to 44.  His ABIs are unchanged from previous 0.95 right 1.16 left with toe pressures of 107 on the right and 141 on the left.     Assessment/Plan:     72 year old male  history of bilateral common iliac artery stenting.  He is stable stenoses in his stents with stable ABIs.  With this we will evaluate him in 1 year with repeat studies.  He will continue Plavix and this was ordered for him today.  If he has issues prior to 1 year we will certainly see him sooner.     Waynetta Sandy MD [facility]

## 2018-06-13 DIAGNOSIS — M542 Cervicalgia: Secondary | ICD-10-CM | POA: Diagnosis not present

## 2018-06-13 DIAGNOSIS — M25511 Pain in right shoulder: Secondary | ICD-10-CM | POA: Diagnosis not present

## 2018-06-27 DIAGNOSIS — M542 Cervicalgia: Secondary | ICD-10-CM | POA: Diagnosis not present

## 2018-07-04 ENCOUNTER — Encounter: Payer: Self-pay | Admitting: Neurology

## 2018-07-05 ENCOUNTER — Other Ambulatory Visit: Payer: Self-pay | Admitting: *Deleted

## 2018-07-05 DIAGNOSIS — G5601 Carpal tunnel syndrome, right upper limb: Secondary | ICD-10-CM

## 2018-07-26 ENCOUNTER — Encounter

## 2018-07-26 ENCOUNTER — Ambulatory Visit (INDEPENDENT_AMBULATORY_CARE_PROVIDER_SITE_OTHER): Payer: Medicare HMO | Admitting: Neurology

## 2018-07-26 DIAGNOSIS — G5601 Carpal tunnel syndrome, right upper limb: Secondary | ICD-10-CM

## 2018-07-26 NOTE — Procedures (Signed)
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  NCV & EMG Findings: Extensive electrodiagnostic testing of the right upper extremity shows:  1. Right median sensory response is absent.  Right ulnar sensory responses within normal limits. 2. Right median motor response shows severely prolonged distal onset latency (7.2 ms) and reduced amplitude (3.9 mV); of note, there is evidence of a median-to-ulnar crossover in the forearm, as evidenced by a motor response when stimulating at the ulnar-wrist, consistent with a Martin-Gruber anastomosis.  The right ulnar motor responses within normal limits. 3. Chronic motor axonal loss changes are seen affecting the deltoid, biceps, and abductor pollicis brevis muscles.  There is no evidence of accompanied active denervation.  Impression: 1. Right median neuropathy at or distal to the wrist, consistent with a clinical diagnosis of carpal tunnel syndrome, which is severe in degree electrically. 2. Chronic C5-C6 radiculopathy affecting the right upper extremity, mild in degree electrically. 3. Incidentally, there is a right Martin-Gruber anastomosis, normal variant.   ___________________________ Narda Amber, DO    Nerve Conduction Studies Anti Sensory Summary Table   Site NR Peak (ms) Norm Peak (ms) P-T Amp (V) Norm P-T Amp  Right Median Anti Sensory (2nd Digit)  36.3C  Wrist NR  <3.8  >10  Right Ulnar Anti Sensory (5th Digit)  36.3C  Wrist    3.2 <3.2 7.7 >5   Motor Summary Table   Site NR Onset (ms) Norm Onset (ms) O-P Amp (mV) Norm O-P Amp  Site1 Site2 Delta-0 (ms) Dist (cm) Vel (m/s) Norm Vel (m/s)  Right Median Motor (Abd Poll Brev)  36.3C  Wrist    7.2 <4.0 3.9 >5 Elbow Wrist 4.3 30.0 70 >50  Elbow    11.5  3.8  Ulnar-wrist crossover Elbow 6.8 0.0    Ulnar-wrist crossover    4.7  4.0         Right Ulnar Motor (Abd Dig Minimi)  36.3C  Wrist    2.4 <3.1 9.0 >7 B Elbow Wrist 4.6 25.0 54 >50  B Elbow    7.0  8.3  A Elbow B Elbow 1.9 10.0 53 >50  A Elbow    8.9  7.9          EMG   Side Muscle Ins Act Fibs Psw Fasc Number Recrt Dur Dur. Amp Amp. Poly Poly. Comment  Right 1stDorInt Nml Nml Nml Nml Nml Nml Nml Nml Nml Nml Nml Nml N/A  Right Abd Poll Brev Nml Nml Nml Nml 3- Rapid Many 1+ Many 1+ Some 1+ N/A  Right PronatorTeres Nml Nml Nml Nml Nml Nml Nml Nml Nml Nml Nml Nml N/A  Right Biceps Nml Nml Nml Nml 1- Rapid Some 1+ Some 1+ Nml Nml N/A  Right Triceps Nml Nml Nml Nml Nml Nml Nml Nml Nml Nml Nml Nml N/A  Right Deltoid Nml Nml Nml Nml 1- Rapid Some 1+ Some 1+ Nml Nml N/A      Waveforms:

## 2018-07-29 DIAGNOSIS — M25531 Pain in right wrist: Secondary | ICD-10-CM | POA: Diagnosis not present

## 2018-08-03 DIAGNOSIS — G5601 Carpal tunnel syndrome, right upper limb: Secondary | ICD-10-CM | POA: Diagnosis not present

## 2018-08-19 DIAGNOSIS — G5601 Carpal tunnel syndrome, right upper limb: Secondary | ICD-10-CM | POA: Diagnosis not present

## 2019-01-18 DIAGNOSIS — G5601 Carpal tunnel syndrome, right upper limb: Secondary | ICD-10-CM | POA: Diagnosis not present

## 2019-04-27 DIAGNOSIS — M48062 Spinal stenosis, lumbar region with neurogenic claudication: Secondary | ICD-10-CM | POA: Diagnosis not present

## 2019-06-22 DIAGNOSIS — M79641 Pain in right hand: Secondary | ICD-10-CM | POA: Diagnosis not present

## 2019-06-22 DIAGNOSIS — M79642 Pain in left hand: Secondary | ICD-10-CM | POA: Diagnosis not present

## 2019-07-03 DIAGNOSIS — Z79899 Other long term (current) drug therapy: Secondary | ICD-10-CM | POA: Diagnosis not present

## 2019-07-03 DIAGNOSIS — N182 Chronic kidney disease, stage 2 (mild): Secondary | ICD-10-CM | POA: Diagnosis not present

## 2019-07-03 DIAGNOSIS — I739 Peripheral vascular disease, unspecified: Secondary | ICD-10-CM | POA: Diagnosis not present

## 2019-07-03 DIAGNOSIS — E1121 Type 2 diabetes mellitus with diabetic nephropathy: Secondary | ICD-10-CM | POA: Diagnosis not present

## 2019-07-03 DIAGNOSIS — E038 Other specified hypothyroidism: Secondary | ICD-10-CM | POA: Diagnosis not present

## 2019-07-03 DIAGNOSIS — E785 Hyperlipidemia, unspecified: Secondary | ICD-10-CM | POA: Diagnosis not present

## 2019-07-03 DIAGNOSIS — Z125 Encounter for screening for malignant neoplasm of prostate: Secondary | ICD-10-CM | POA: Diagnosis not present

## 2019-07-03 DIAGNOSIS — I10 Essential (primary) hypertension: Secondary | ICD-10-CM | POA: Diagnosis not present

## 2019-07-03 DIAGNOSIS — Z0000 Encounter for general adult medical examination without abnormal findings: Secondary | ICD-10-CM | POA: Diagnosis not present

## 2019-08-25 ENCOUNTER — Other Ambulatory Visit: Payer: Self-pay | Admitting: Vascular Surgery

## 2020-02-21 ENCOUNTER — Ambulatory Visit: Payer: Medicare HMO | Admitting: Orthopaedic Surgery

## 2020-02-21 ENCOUNTER — Ambulatory Visit: Payer: Self-pay

## 2020-02-21 ENCOUNTER — Encounter: Payer: Self-pay | Admitting: Orthopaedic Surgery

## 2020-02-21 ENCOUNTER — Other Ambulatory Visit: Payer: Self-pay

## 2020-02-21 VITALS — BP 176/58 | HR 56 | Ht 71.0 in | Wt 225.0 lb

## 2020-02-21 DIAGNOSIS — G8929 Other chronic pain: Secondary | ICD-10-CM | POA: Diagnosis not present

## 2020-02-21 DIAGNOSIS — M545 Low back pain: Secondary | ICD-10-CM | POA: Diagnosis not present

## 2020-02-21 NOTE — Progress Notes (Signed)
Office Visit Note   Patient: Peter Walker           Date of Birth: Dec 18, 1945           MRN: [id number] Visit Date: 02/21/2020              Requested by: Jonathon Jordan, MD [address] [place],  [place] [address] PCP: Jonathon Jordan, MD   Assessment & Plan: Visit Diagnoses:  1. Chronic right-sided low back pain, unspecified whether sciatica present     Plan: Patient significant increased pain with falling previous very large facet cyst causing significant spinal stenosis I recommend repeat MRI scan.  We discussed with him he may require operative intervention.  Office follow-up after MRI scan.  Follow-Up Instructions: return after MRI lumbar  Orders:  Orders Placed This Encounter  Procedures  . XR Lumbar Spine 2-3 Views  . MR Lumbar Spine w/o contrast   No orders of the defined types were placed in this encounter.     Procedures: No procedures performed   Clinical Data: No additional findings.   Subjective: Chief Complaint  Patient presents with  . 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  He states he had sharp pain at one point which caused him to fall.  He has had previous large right L4-5 facet cyst noted on previous MRI in 2017.  He denies bowel bladder symptoms.  He has pain with standing and gets relieved with sitting.  Review of Systems 14 point review of systems updated unchanged from 09/08/2016 office visit other than as mentioned in HPI.  Positive claudication symptoms, PAD hypertension and previous angioplasties.   Objective: Vital Signs: BP (!) 176/58   Pulse (!) 56   Ht 5\' 11"  (1.803 m)   Wt 225 lb (102.1 kg)   BMI 31.38 kg/m   Physical Exam Constitutional:      Appearance: He is well-developed.  HENT:    Head: Normocephalic and atraumatic.  Eyes:     Pupils: Pupils are equal, round, and reactive to light.  Neck:     Thyroid: No thyromegaly.     Trachea: No tracheal deviation.  Cardiovascular:     Rate and Rhythm: Normal rate.  Pulmonary:     Effort: Pulmonary effort is normal.     Breath sounds: No wheezing.  Abdominal:     General: Bowel sounds are normal.     Palpations: Abdomen is soft.  Skin:    General: Skin is warm and dry.     Capillary Refill: Capillary refill takes less than 2 seconds.  Neurological:     Mental Status: He is alert and oriented to person, place, and time.  Psychiatric:        Behavior: Behavior normal.        Thought Content: Thought content normal.        Judgment: Judgment normal.     Ortho Exam patient has sciatic notch tenderness on the right some pain with straight leg raising anterior tib gastrocsoleus is intact he is able to heel and toe walk.  Specialty Comments:  No specialty comments available.  Imaging: AP and  lateral lumbar spine x-rays are obtained and reviewed.  This shows a few millimeters  retrolisthesis disc space narrowing at L2-3 and also at L5-S1 with facet arthropathy.  Impression: Lumbar spondylosis.  Retrolisthesis at L2-3 as described above.  Negative for acute changes.   PMFS History: Patient Active Problem List   Diagnosis Date Noted  . Spinal stenosis of lumbar region with neurogenic claudication 09/08/2016  . PAD (peripheral artery disease) ([facility]) 12/10/2015  . Peripheral vascular disease, unspecified ([facility]) 07/27/2012  . Atherosclerosis of native arteries of the extremities with intermittent claudication 01/26/2012   Past Medical History:  Diagnosis Date  . Aneurysm, common iliac artery ([facility]) 02/23/2001   no hx of CIA aneurysm seen 09/17/16, but has PAD with iliac occlusive disease; Left  and Severe claudication Left leg  secondary to  iliac occlusive  disease 03/06/2004  . Degenerative joint disease   . Diabetes  mellitus    Type II  . Hypertension   . PAD (peripheral artery disease) ([facility])    left CIA stents '02, '05, right CIA stent '03    Family History  Problem Relation Age of Onset  . Diabetes Mother   . Heart disease Mother   . Diabetes Father   . Hyperlipidemia Father   . Hypertension Father   . Heart disease Father     Past Surgical History:  Procedure Laterality Date  . ANGIOPLASTY     common iliac artery L '02, '05; R '03  . LOWER EXTREMITY ANGIOGRAPHY  03/25/2017   Procedure: Lower Extremity Angiography;  Surgeon: Waynetta Sandy, MD;  Location: [facility];  Service: Cardiovascular;;  . PERIPHERAL VASCULAR INTERVENTION Right 03/25/2017   Procedure: Peripheral Vascular Intervention;  Surgeon: Waynetta Sandy, MD;  Location: [facility];  Service: Cardiovascular;  Laterality: Right;  common iliac   Social History   Occupational History  . Not on file  Tobacco Use  . Smoking status: Former Smoker    Packs/day: 2.00    Years: 37.00    Pack years: 74.00    Types: Cigarettes    Quit date: 03/12/2004    Years since quitting: 15.9  . Smokeless tobacco: Never Used  Substance and Sexual Activity  . Alcohol use: No    Alcohol/week: 0.0 standard drinks  . Drug use: No  . Sexual activity: Not on file

## 2020-03-05 ENCOUNTER — Ambulatory Visit
Admission: RE | Admit: 2020-03-05 | Discharge: 2020-03-05 | Disposition: A | Discharge status: Home / Self Care | Payer: Medicare HMO | Source: Ambulatory Visit | Attending: Orthopaedic Surgery | Admitting: Orthopaedic Surgery

## 2020-03-05 DIAGNOSIS — G8929 Other chronic pain: Secondary | ICD-10-CM

## 2020-03-05 DIAGNOSIS — M48061 Spinal stenosis, lumbar region without neurogenic claudication: Secondary | ICD-10-CM | POA: Diagnosis not present

## 2020-03-12 ENCOUNTER — Encounter: Payer: Self-pay | Admitting: Orthopaedic Surgery

## 2020-03-12 ENCOUNTER — Ambulatory Visit: Payer: Medicare HMO | Admitting: Orthopaedic Surgery

## 2020-03-12 VITALS — BP 161/64 | HR 67 | Ht 71.0 in | Wt 225.0 lb

## 2020-03-12 DIAGNOSIS — M48062 Spinal stenosis, lumbar region with neurogenic claudication: Secondary | ICD-10-CM | POA: Diagnosis not present

## 2020-03-12 NOTE — Progress Notes (Signed)
Office Visit Note   Patient: Peter Walker           Date of Birth: 1946/05/23           MRN: [id number] Visit Date: 03/12/2020              Requested by: Jonathon Jordan, MD [address] [place],  [place] [address] PCP: Jonathon Jordan, MD   Assessment & Plan: Visit Diagnoses: No diagnosis found.  Plan: Patient has large intraspinal extradural facet cyst causing severe lateral recess compression and cyst takes 40 to 50% of the canal.  There is less water in it from 2007 and appears that is consolidated some and may have some fibrosis.  He still has severe lateral recess stenosis and nerve compression consistent with his nerve root symptoms.  He does not have any instability on flexion-extension and has failed conservative treatment including anti-inflammatories occasional use of narcotic medication, muscle relaxants and epidurals x2 which were not successful in and helping his pain.  Plan would be hemilaminectomy with removal of extradural intraspinal facet cyst with lateral recess decompression.  Procedure discussed plan would be overnight stay he understands sometimes the cyst is adherent to the dura and the microscope is needed to dissected and there is some risk of dural tear that might require repair if there is fibrosis from his previous injections.  Questions were elicited and answered he understands request to proceed.  We will need to have clearance from endocrinologist concerning his diabetes.  His wife was present today for the discussion and questions were elicited and answered concerning recommended treatment.  Follow-Up Instructions: No follow-ups on file.   Orders:  No orders of the defined types were placed in this encounter.  No orders of the defined types were placed in this encounter.     Procedures: No procedures performed   Clinical Data: No additional findings.   Subjective: Chief Complaint  Patient presents with  . 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  Previous MRI was 2017 which showed large facet cyst causing severe lateral recess stenosis on the right side which corresponds with this back buttocks and right leg radicular symptoms.  He has had pain with standing and gets relief with sitting.  He still is able to play rotating sports and has been able to bowl some and also even playing golf but states she has significant increased pain with standing and gets some relief with sitting.  Patient additionally has some diabetes I do not have a current A1c.  He states his control has not been quite as good as he is liked and has had some problems with his sugar drops as low as 40 in the middle the night for his last diabetic insulin regiment was adjusted.  He has peripheral arterial disease hypertension and has had previous angioplasties without current chest pain.  New lumbar MRI scan has been obtained and is available for review.  Review of Systems 14 point system unchanged from 09/08/2016 office visit other than this mention in HPI.  Type 2 diabetes on insulin.   Objective: Vital Signs: BP (!) 161/64   Pulse 67   Ht 5\' 11"  (1.803 m)   Wt 225 lb (102.1 kg)   BMI 31.38 kg/m   Physical Exam Constitutional:      Appearance: He is well-developed.  HENT:     Head: Normocephalic and atraumatic.  Eyes:     Pupils: Pupils are equal, round, and reactive to light.  Neck:     Thyroid: No thyromegaly.     Trachea: No tracheal deviation.  Cardiovascular:     Rate and Rhythm: Normal rate.  Pulmonary:     Effort: Pulmonary effort is normal.     Breath sounds: No wheezing.  Abdominal:     General: Bowel sounds are normal.     Palpations: Abdomen is soft.  Skin:    General: Skin is warm and dry.     Capillary Refill: Capillary refill takes less than 2 seconds.  Neurological:     Mental  Status: He is alert and oriented to person, place, and time.  Psychiatric:        Behavior: Behavior normal.        Thought Content: Thought content normal.        Judgment: Judgment normal.     Ortho Exam patient sciatic notch tenderness on the right pain with straight leg raising at 80 degrees.  Gastrocsoleus anterior tib is intact and he can still heel and toe walk. Specialty Comments:  No specialty comments available.  Imaging: CLINICAL DATA:  74 year old male with recurrent right leg pain, progressive. No known injury. History of right L4-L5 facet cyst.  EXAM: MRI LUMBAR SPINE WITHOUT CONTRAST  TECHNIQUE: Multiplanar, multisequence MR imaging of the lumbar spine was performed. No intravenous contrast was administered.  COMPARISON:  Lumbar MRI 08/07/2016.  Lumbar radiographs 02/21/2020.  FINDINGS: Segmentation: Normal on the comparison radiographs which is the same numbering system used in 2017.  Alignment: Stable since 2017. Chronic retrolisthesis of L5 on S1. Subtle anterolisthesis of L4 on L5. Superimposed straightening of lower lumbar lordosis. Mild chronic retrolisthesis of L2 on L3.  Vertebrae: No marrow edema or evidence of acute osseous abnormality. There is a left lateral chronic L4 inferior endplate Schmorl's node. Chronic degenerative endplate marrow signal changes elsewhere with normal background bone marrow signal. Intact visible sacrum and SI joints.  Conus medullaris and cauda equina: Conus extends to the L1 level. No lower spinal cord or conus signal abnormality.  Paraspinal and other soft tissues: Negative; small benign renal cysts again noted.  Disc levels:  No lower thoracic through L1-L2 spinal stenosis or age advanced degeneration.  L2-L3: Mild retrolisthesis with disc space loss, circumferential disc bulge, endplate spurring and mild posterior element hypertrophy. No spinal or convincing lateral recess stenosis. Moderate left  and moderate to severe right L2 foraminal stenosis appear progressed.  L3-L4: Increased circumferential disc bulge with a broad-based posterior component. Moderate posterior element hypertrophy has also progressed. No spinal stenosis. Mild new bilateral lateral recess stenosis (L4 nerve levels). Moderate to severe bilateral L3 foraminal stenosis has progressed.  L4-L5: Chronic bulky circumferential disc bulge with broad-based posterior and bilateral foraminal component. Moderate to severe posterior element hypertrophy including bulky right side cyst or spurring projecting into the spinal canal (series 5, image 27) and this appears more solid and less cystic since 2017.  Severe right lateral recess stenosis (right L5 nerve level). Increased mild to moderate spinal stenosis. Moderate to severe bilateral L4 foraminal stenosis, stable on the left and mildly progressed on the right.  L5-S1: Chronic retrolisthesis and severe disc space loss with bulky circumferential disc osteophyte complex. Moderate posterior element hypertrophy. However, no spinal stenosis and only borderline to mild lateral recess stenosis. Severe left and moderate to severe right L5 foraminal stenosis appears stable.  IMPRESSION: 1. Advanced chronic lumbar spine degeneration with no acute osseous abnormality.  2. Bulky right side posterior element hypertrophy at L4-L5, appears more solid and less cystic since the 2017 MRI, and results in severe right lateral recess stenosis as before. Increased mild to moderate spinal and moderate to severe right greater than left foraminal stenosis also. Query right L4 and/or L5 radiculitis.  3. Progressed L3-L4 degeneration since 2017 with mild new lateral recess stenosis, progressed moderate to severe bilateral L3 foraminal stenosis.  4. Progressed bilateral moderate to severe L2-L3 foraminal stenosis.  5. Advanced chronic L5-S1 degeneration with stable moderate  to severe bilateral foraminal stenosis.   Electronically Signed   By: Genevie Ann M.D.   On: 03/05/2020 12:24    PMFS History: Patient Active Problem List   Diagnosis Date Noted  . Spinal stenosis of lumbar region with neurogenic claudication 09/08/2016  . PAD (peripheral artery disease) ([facility]) 12/10/2015  . Peripheral vascular disease, unspecified ([facility]) 07/27/2012  . Atherosclerosis of native arteries of the extremities with intermittent claudication 01/26/2012   Past Medical History:  Diagnosis Date  . Aneurysm, common iliac artery ([facility]) 02/23/2001   no hx of CIA aneurysm seen 09/17/16, but has PAD with iliac occlusive disease; Left  and Severe claudication Left leg  secondary to  iliac occlusive  disease 03/06/2004  . Degenerative joint disease   . Diabetes mellitus    Type II  . Hypertension   . PAD (peripheral artery disease) ([facility])    left CIA stents '02, '05, right CIA stent '03    Family History  Problem Relation Age of Onset  . Diabetes Mother   . Heart disease Mother   . Diabetes Father   . Hyperlipidemia Father   . Hypertension Father   . Heart disease Father     Past Surgical History:  Procedure Laterality Date  . ANGIOPLASTY     common iliac artery L '02, '05; R '03  . LOWER EXTREMITY ANGIOGRAPHY  03/25/2017   Procedure: Lower Extremity Angiography;  Surgeon: Waynetta Sandy, MD;  Location: [facility];  Service: Cardiovascular;;  . PERIPHERAL VASCULAR INTERVENTION Right 03/25/2017   Procedure: Peripheral Vascular Intervention;  Surgeon: Waynetta Sandy, MD;  Location: [facility];  Service: Cardiovascular;  Laterality: Right;  common iliac   Social History   Occupational History  . Not on file  Tobacco Use  . Smoking status: Former Smoker    Packs/day: 2.00    Years: 37.00    Pack years: 74.00    Types: Cigarettes    Quit date: 03/12/2004    Years since quitting: 16.0  . Smokeless tobacco: Never Used  Substance and  Sexual Activity  . Alcohol use: No    Alcohol/week: 0.0 standard drinks  . Drug use: No  . Sexual activity: Not on file

## 2020-03-25 DIAGNOSIS — Z131 Encounter for screening for diabetes mellitus: Secondary | ICD-10-CM | POA: Diagnosis not present

## 2020-03-25 DIAGNOSIS — E119 Type 2 diabetes mellitus without complications: Secondary | ICD-10-CM | POA: Diagnosis not present

## 2020-03-25 DIAGNOSIS — Z794 Long term (current) use of insulin: Secondary | ICD-10-CM | POA: Diagnosis not present

## 2020-03-25 DIAGNOSIS — Z01818 Encounter for other preprocedural examination: Secondary | ICD-10-CM | POA: Diagnosis not present

## 2020-03-25 DIAGNOSIS — I10 Essential (primary) hypertension: Secondary | ICD-10-CM | POA: Diagnosis not present

## 2020-03-26 DIAGNOSIS — L905 Scar conditions and fibrosis of skin: Secondary | ICD-10-CM | POA: Diagnosis not present

## 2020-03-26 DIAGNOSIS — Z85828 Personal history of other malignant neoplasm of skin: Secondary | ICD-10-CM | POA: Diagnosis not present

## 2020-03-26 DIAGNOSIS — D225 Melanocytic nevi of trunk: Secondary | ICD-10-CM | POA: Diagnosis not present

## 2020-03-26 DIAGNOSIS — L570 Actinic keratosis: Secondary | ICD-10-CM | POA: Diagnosis not present

## 2020-03-26 DIAGNOSIS — D1801 Hemangioma of skin and subcutaneous tissue: Secondary | ICD-10-CM | POA: Diagnosis not present

## 2020-03-26 DIAGNOSIS — L821 Other seborrheic keratosis: Secondary | ICD-10-CM | POA: Diagnosis not present

## 2020-03-26 DIAGNOSIS — C44329 Squamous cell carcinoma of skin of other parts of face: Secondary | ICD-10-CM | POA: Diagnosis not present

## 2020-03-26 DIAGNOSIS — D485 Neoplasm of uncertain behavior of skin: Secondary | ICD-10-CM | POA: Diagnosis not present

## 2020-04-11 DIAGNOSIS — C4441 Basal cell carcinoma of skin of scalp and neck: Secondary | ICD-10-CM | POA: Diagnosis not present

## 2020-04-11 DIAGNOSIS — C44329 Squamous cell carcinoma of skin of other parts of face: Secondary | ICD-10-CM | POA: Diagnosis not present

## 2020-04-16 DIAGNOSIS — E7800 Pure hypercholesterolemia, unspecified: Secondary | ICD-10-CM | POA: Diagnosis not present

## 2020-04-16 DIAGNOSIS — I10 Essential (primary) hypertension: Secondary | ICD-10-CM | POA: Diagnosis not present

## 2020-04-16 DIAGNOSIS — E1165 Type 2 diabetes mellitus with hyperglycemia: Secondary | ICD-10-CM | POA: Diagnosis not present

## 2020-04-16 DIAGNOSIS — E1121 Type 2 diabetes mellitus with diabetic nephropathy: Secondary | ICD-10-CM | POA: Diagnosis not present

## 2020-04-16 DIAGNOSIS — I739 Peripheral vascular disease, unspecified: Secondary | ICD-10-CM | POA: Diagnosis not present

## 2020-04-25 DIAGNOSIS — I10 Essential (primary) hypertension: Secondary | ICD-10-CM | POA: Diagnosis not present

## 2020-04-25 DIAGNOSIS — E1165 Type 2 diabetes mellitus with hyperglycemia: Secondary | ICD-10-CM | POA: Diagnosis not present

## 2020-05-07 DIAGNOSIS — L905 Scar conditions and fibrosis of skin: Secondary | ICD-10-CM | POA: Diagnosis not present

## 2020-05-07 DIAGNOSIS — L570 Actinic keratosis: Secondary | ICD-10-CM | POA: Diagnosis not present

## 2020-05-07 DIAGNOSIS — Z85828 Personal history of other malignant neoplasm of skin: Secondary | ICD-10-CM | POA: Diagnosis not present

## 2020-05-16 DIAGNOSIS — C4441 Basal cell carcinoma of skin of scalp and neck: Secondary | ICD-10-CM | POA: Diagnosis not present

## 2020-05-29 DIAGNOSIS — E1165 Type 2 diabetes mellitus with hyperglycemia: Secondary | ICD-10-CM | POA: Diagnosis not present

## 2020-06-01 ENCOUNTER — Other Ambulatory Visit: Payer: Self-pay | Admitting: Vascular Surgery

## 2020-06-19 ENCOUNTER — Encounter: Payer: Self-pay | Admitting: Orthopaedic Surgery

## 2020-06-19 ENCOUNTER — Ambulatory Visit: Payer: Medicare HMO | Admitting: Orthopaedic Surgery

## 2020-06-19 VITALS — Ht 71.0 in | Wt 209.6 lb

## 2020-06-19 DIAGNOSIS — M7138 Other bursal cyst, other site: Secondary | ICD-10-CM | POA: Diagnosis not present

## 2020-06-19 DIAGNOSIS — M48062 Spinal stenosis, lumbar region with neurogenic claudication: Secondary | ICD-10-CM | POA: Diagnosis not present

## 2020-06-19 NOTE — Progress Notes (Signed)
Office Visit Note   Patient: Peter Walker           Date of Birth: May 04, 1946           MRN: [id number] Visit Date: 06/19/2020              Requested by: Jonathon Jordan, MD [address] [place],  [place] [address] PCP: Jonathon Jordan, MD   Assessment & Plan: Visit Diagnoses:  1. Spinal stenosis of lumbar region with neurogenic claudication   2. Synovial cyst of lumbar facet joint     Plan: Patient has radicular symptoms from intraspinal extradural facet cyst at L4-5 on the left side which covers at least half the canal.  He has lateral recess stenosis and plan would be lateral recess decompression with hemilaminectomy removal of the cyst.  Long discussion with the patient concerning potential for cyst recurrence and if this did occur then I would recommend fusion.  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  His principal problem is radicular symptoms and removal of the facet cyst as long as it did not recur should take care of the problem.  We quoted with him a 10 to 50% chance of cyst recurrence which is variable.  Pathophysiology discussed rationale for treatment discussed risk surgery discussed including dural tear reoperation, progression of instability possible need for fusion.  He understands request to proceed.  He has had previous ABIs without significant abnormality.  Today's visit we reviewed plain radiographs as well as MRI scan 2017 while his current 2021 lumbar MRI.  I gave him a copy of the report.  Follow-Up Instructions: No follow-ups on file.   Orders:  No orders of the defined types were placed in this encounter.  No orders of the defined  types were placed in this encounter.     Procedures: No procedures performed   Clinical Data: No additional findings.   Subjective: Chief Complaint  Patient presents with   Lower Back - Pain    HPI 74 year old male returns with intraspinal extradural facet cyst at L4-5 causing severe compression with left leg pain.  Previous MRI 2007 lumbar and recent MRI scan 03/05/2020 shows the cyst may have slightly enlarged but is now more solid in appearance than liquid field and 2007.  He has had epidurals which has given him temporary relief he has been through physical therapy, taken narcotic medication, muscle relaxants, traction.  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  Patient states his A1c is now finally under control and is 7.1 and is ready to proceed with scheduling surgery.  Surgery had been delayed due to poor diabetic control in previous months.  Review of Systems Constitutional: Negative for chills and diaphoresis.  HENT: Negative for ear discharge, ear pain and nosebleeds.   Eyes: Negative for discharge and visual disturbance.  Respiratory: Negative for cough, choking and shortness of breath.   Cardiovascular: Negative for chest pain and palpitations.       Positive for history of peripheral arterial disease and HTN previous angioplasty 2003 and 2005  Gastrointestinal: Negative for abdominal distention and abdominal pain.  Endocrine: Negative for cold intolerance and heat intolerance.  Genitourinary: Negative for flank pain and hematuria.  Musculoskeletal: Positive for back pain.       Positive for claudication symptoms pain with standing more than 30 seconds. He gets relief with sitting position and leg extension both at the hip and knee  Skin: Negative for rash and wound.  Neurological: Positive for numbness. Negative for seizures and speech difficulty.  Hematological: Negative for adenopathy. Does not bruise/bleed easily.    Psychiatric/Behavioral: Negative for agitation and suicidal ideas.    Objective: Vital Signs: Ht 5\' 11"  (1.803 m)    Wt 209 lb 9.6 oz (95.1 kg)    BMI 29.23 kg/m   Physical Exam Constitutional:      Appearance: He is well-developed.  HENT:     Head: Normocephalic and atraumatic.  Eyes:     Pupils: Pupils are equal, round, and reactive to light.  Neck:     Thyroid: No thyromegaly.     Trachea: No tracheal deviation.  Cardiovascular:     Rate and Rhythm: Normal rate.  Pulmonary:     Effort: Pulmonary effort is normal.     Breath sounds: No wheezing.  Abdominal:     General: Bowel sounds are normal.     Palpations: Abdomen is soft.  Skin:    General: Skin is warm and dry.     Capillary Refill: Capillary refill takes less than 2 seconds.  Neurological:     Mental Status: He is alert and oriented to person, place, and time.  Psychiatric:        Behavior: Behavior normal.        Thought Content: Thought content normal.        Judgment: Judgment normal.     Ortho Exam patient is left sciatic notch tenderness positive straight leg raising at 80 degrees.  Patient can heel and toe walk.  Positive popliteal compression test on the left negative on the right.  Skin of the lumbar spine is normal. Specialty Comments:  No specialty comments available.  Imaging: CLINICAL DATA:  74 year old male with recurrent right leg pain, progressive. No known injury. History of right L4-L5 facet cyst.  EXAM: MRI LUMBAR SPINE WITHOUT CONTRAST  TECHNIQUE: Multiplanar, multisequence MR imaging of the lumbar spine was performed. No intravenous contrast was administered.  COMPARISON:  Lumbar MRI 08/07/2016.  Lumbar radiographs 02/21/2020.  FINDINGS: Segmentation: Normal on the comparison radiographs which is the same numbering system used in 2017.  Alignment: Stable since 2017. Chronic retrolisthesis of L5 on S1. Subtle anterolisthesis of L4 on L5. Superimposed straightening  of lower lumbar lordosis. Mild chronic retrolisthesis of L2 on L3.  Vertebrae: No marrow edema or evidence of acute osseous abnormality. There is a left lateral chronic L4 inferior endplate Schmorl's node. Chronic degenerative endplate marrow signal changes elsewhere with normal background bone marrow signal. Intact visible sacrum and SI joints.  Conus medullaris and cauda equina: Conus extends to the L1 level. No lower spinal cord or conus signal abnormality.  Paraspinal and other soft tissues: Negative; small benign renal cysts again noted.  Disc levels:  No lower thoracic through L1-L2 spinal stenosis or age advanced degeneration.  L2-L3: Mild retrolisthesis with disc space loss, circumferential disc bulge, endplate spurring and mild posterior element hypertrophy. No spinal or convincing lateral recess stenosis. Moderate left and moderate to severe right L2 foraminal stenosis appear progressed.  L3-L4: Increased circumferential disc bulge with a broad-based posterior component. Moderate posterior element hypertrophy has also progressed.  No spinal stenosis. Mild new bilateral lateral recess stenosis (L4 nerve levels). Moderate to severe bilateral L3 foraminal stenosis has progressed.  L4-L5: Chronic bulky circumferential disc bulge with broad-based posterior and bilateral foraminal component. Moderate to severe posterior element hypertrophy including bulky right side cyst or spurring projecting into the spinal canal (series 5, image 27) and this appears more solid and less cystic since 2017.  Severe right lateral recess stenosis (right L5 nerve level). Increased mild to moderate spinal stenosis. Moderate to severe bilateral L4 foraminal stenosis, stable on the left and mildly progressed on the right.  L5-S1: Chronic retrolisthesis and severe disc space loss with bulky circumferential disc osteophyte complex. Moderate posterior element hypertrophy. However, no  spinal stenosis and only borderline to mild lateral recess stenosis. Severe left and moderate to severe right L5 foraminal stenosis appears stable.  IMPRESSION: 1. Advanced chronic lumbar spine degeneration with no acute osseous abnormality.  2. Bulky right side posterior element hypertrophy at L4-L5, appears more solid and less cystic since the 2017 MRI, and results in severe right lateral recess stenosis as before. Increased mild to moderate spinal and moderate to severe right greater than left foraminal stenosis also. Query right L4 and/or L5 radiculitis.  3. Progressed L3-L4 degeneration since 2017 with mild new lateral recess stenosis, progressed moderate to severe bilateral L3 foraminal stenosis.  4. Progressed bilateral moderate to severe L2-L3 foraminal stenosis.  5. Advanced chronic L5-S1 degeneration with stable moderate to severe bilateral foraminal stenosis.   Electronically Signed   By: Genevie Ann M.D.   On: 03/05/2020 12:24   PMFS History: Patient Active Problem List   Diagnosis Date Noted   Synovial cyst of lumbar facet joint 06/19/2020   Spinal stenosis of lumbar region with neurogenic claudication 09/08/2016   PAD (peripheral artery disease) ([facility]) 12/10/2015   Peripheral vascular disease, unspecified ([facility]) 07/27/2012   Atherosclerosis of native arteries of the extremities with intermittent claudication 01/26/2012   Past Medical History:  Diagnosis Date   Aneurysm, common iliac artery ([facility]) 02/23/2001   no hx of CIA aneurysm seen 09/17/16, but has PAD with iliac occlusive disease; Left  and Severe claudication Left leg  secondary to  iliac occlusive  disease 03/06/2004   Degenerative joint disease    Diabetes mellitus    Type II   Hypertension    PAD (peripheral artery disease) ([facility])    left CIA stents '02, '05, right CIA stent '03    Family History  Problem Relation Age of Onset   Diabetes Mother    Heart disease Mother    Diabetes  Father    Hyperlipidemia Father    Hypertension Father    Heart disease Father     Past Surgical History:  Procedure Laterality Date   ANGIOPLASTY     common iliac artery L '02, '05; R '03   LOWER EXTREMITY ANGIOGRAPHY  03/25/2017   Procedure: Lower Extremity Angiography;  Surgeon: Waynetta Sandy, MD;  Location: [facility];  Service: Cardiovascular;;   PERIPHERAL VASCULAR INTERVENTION Right 03/25/2017   Procedure: Peripheral Vascular Intervention;  Surgeon: Waynetta Sandy, MD;  Location: [facility];  Service: Cardiovascular;  Laterality: Right;  common iliac   Social History   Occupational History   Not on file  Tobacco Use   Smoking status: Former Smoker    Packs/day: 2.00    Years: 37.00    Pack years: 74.00    Types: Cigarettes    Quit date: 03/12/2004  Years since quitting: 16.2   Smokeless tobacco: Never Used  Vaping Use   Vaping Use: Never used  Substance and Sexual Activity   Alcohol use: No    Alcohol/week: 0.0 standard drinks   Drug use: No   Sexual activity: Not on file

## 2020-06-26 ENCOUNTER — Other Ambulatory Visit: Payer: Self-pay

## 2020-07-01 DIAGNOSIS — C4441 Basal cell carcinoma of skin of scalp and neck: Secondary | ICD-10-CM | POA: Diagnosis not present

## 2020-07-15 ENCOUNTER — Telehealth: Payer: Self-pay | Admitting: Orthopaedic Surgery

## 2020-07-15 NOTE — Telephone Encounter (Signed)
Do you have any idea when his COVID test is?

## 2020-07-15 NOTE — Telephone Encounter (Signed)
Pts daughter called wanting to know if the pt would need to quarantine before his surgery? If so they would like to know for how long  [phone]

## 2020-07-17 ENCOUNTER — Ambulatory Visit: Payer: Medicare HMO | Admitting: Surgery

## 2020-07-17 ENCOUNTER — Encounter: Payer: Self-pay | Admitting: Surgery

## 2020-07-17 VITALS — BP 138/77 | HR 67 | Temp 97.4°F

## 2020-07-17 DIAGNOSIS — N182 Chronic kidney disease, stage 2 (mild): Secondary | ICD-10-CM | POA: Diagnosis not present

## 2020-07-17 DIAGNOSIS — M545 Low back pain, unspecified: Secondary | ICD-10-CM

## 2020-07-17 DIAGNOSIS — Z79899 Other long term (current) drug therapy: Secondary | ICD-10-CM | POA: Diagnosis not present

## 2020-07-17 DIAGNOSIS — Z0000 Encounter for general adult medical examination without abnormal findings: Secondary | ICD-10-CM | POA: Diagnosis not present

## 2020-07-17 DIAGNOSIS — E785 Hyperlipidemia, unspecified: Secondary | ICD-10-CM | POA: Diagnosis not present

## 2020-07-17 DIAGNOSIS — Z125 Encounter for screening for malignant neoplasm of prostate: Secondary | ICD-10-CM | POA: Diagnosis not present

## 2020-07-17 DIAGNOSIS — M48062 Spinal stenosis, lumbar region with neurogenic claudication: Secondary | ICD-10-CM | POA: Diagnosis not present

## 2020-07-17 DIAGNOSIS — E038 Other specified hypothyroidism: Secondary | ICD-10-CM | POA: Diagnosis not present

## 2020-07-17 DIAGNOSIS — G8929 Other chronic pain: Secondary | ICD-10-CM

## 2020-07-17 DIAGNOSIS — I10 Essential (primary) hypertension: Secondary | ICD-10-CM | POA: Diagnosis not present

## 2020-07-17 DIAGNOSIS — I739 Peripheral vascular disease, unspecified: Secondary | ICD-10-CM | POA: Diagnosis not present

## 2020-07-17 DIAGNOSIS — M7138 Other bursal cyst, other site: Secondary | ICD-10-CM

## 2020-07-17 DIAGNOSIS — E1121 Type 2 diabetes mellitus with diabetic nephropathy: Secondary | ICD-10-CM | POA: Diagnosis not present

## 2020-07-17 NOTE — Progress Notes (Signed)
74 year old male with history of L4-5 stenosis and intraspinal extradural facet cyst comes in for preop evaluation.  He continues have ongoing low back pain and right lower extremity radiculopathy.  Symptoms unchanged from previous visit.  He is wanting to proceed with right L4 hemilaminectomy with excision intraspinal extradural facet cyst as scheduled.  Surgery procedure previously discussed with him.  Today history and physical performed.  Review of systems negative.  All questions answered.

## 2020-07-19 NOTE — Pre-Procedure Instructions (Signed)
Peter Walker  07/19/2020      [id number] - [place], [place] [address] SEC OF [id number] [place] [address] Phone: [phone] Fax: [phone]    Your procedure is scheduled on Oct. 25  Report to [facility] Entrance A at 10:30 A.M.  Call this number if you have problems the morning of surgery:  [phone]   Remember:  Do not eat or drink after midnight.   You may drink clear liquids until 9:30.  Clear liquids allowed are:                    Water, Juice (non-citric and without pulp - diabetics please choose diet or no sugar options), Carbonated beverages - (diabetics please choose diet or no sugar options), Clear Tea, Black Coffee only (no creamer, milk or cream including half and half), Plain Jell-O only (diabetics please choose diet or no sugar options), Gatorade (diabetics please choose diet or no sugar options) and Plain Popsicles only                        Enhanced Recovery after Surgery for Orthopedics Enhanced Recovery after Surgery is a protocol used to improve the stress on your body and your recovery after surgery.  Patient Instructions  . The night before surgery:  o No food after midnight. ONLY clear liquids after midnight  .  Marland Kitchen The day of surgery (if you do NOT have diabetes):  o Drink ONE (1) Pre-Surgery Clear Ensure by _____ am the morning of surgery   o This drink was given to you during your hospital  pre-op appointment visit. o Nothing else to drink after completing the  Pre-Surgery Clear Ensure.  . The day of surgery (if you have diabetes): o  o Drink ONE (1) Gatorade 2 (G2) by __9:30 AM___ the morning of surgery o This drink was given to you during your hospital  pre-op appointment visit.  o Color of the Gatorade may vary. Red is not allowed. o Nothing else to drink after completing the  Gatorade 2 (G2).         If you have questions, please contact your surgeon's  office.     Take these medicines the morning of surgery with A SIP OF WATER :              Amlodipine (norvasc)             Hydrocodone if needed             Omeprazole (prilosec)             Rosuvastatin (crestor)              7 days prior to surgery STOP taking any Aspirin (unless otherwise instructed by your surgeon), Aleve, Naproxen, Ibuprofen, Motrin, Advil, Goody's, BC's, all herbal medications, fish oil, and all vitamins.                  Follow your surgeon's instructions on when to stop Plavix.  If no instructions were given by your surgeon then you will need to call the office to get those instructions.  How to Manage Your Diabetes Before and After Surgery  Why is it important to control my blood sugar before and after surgery? . Improving blood sugar levels before and after surgery helps healing and can limit problems. . A way of improving blood sugar control is eating a healthy diet by: o  Eating less sugar and carbohydrates o  Increasing activity/exercise o  Talking with your doctor about reaching your blood sugar goals . High blood sugars (greater than 180 mg/dL) can raise your risk of infections and slow your recovery, so you will need to focus on controlling your diabetes during the weeks before surgery. . Make sure that the doctor who takes care of your diabetes knows about your planned surgery including the date and location.  How do I manage my blood sugar before surgery? . Check your blood sugar at least 4 times a day, starting 2 days before surgery, to make sure that the level is not too high or low. o Check your blood sugar the morning of your surgery when you wake up and every 2 hours until you get to the Short Stay unit. . If your blood sugar is less than 70 mg/dL, you will need to treat for low blood sugar: o Do not take insulin. o Treat a low blood sugar (less than 70 mg/dL) with  cup of clear juice  (cranberry or apple), 4 glucose tablets, OR glucose gel. Recheck blood sugar in 15 minutes after treatment (to make sure it is greater than 70 mg/dL). If your blood sugar is not greater than 70 mg/dL on recheck, call [phone] o  for further instructions. . Report your blood sugar to the short stay nurse when you get to Short Stay.  . If you are admitted to the hospital after surgery: o Your blood sugar will be checked by the staff and you will probably be given insulin after surgery (instead of oral diabetes medicines) to make sure you have good blood sugar levels. o The goal for blood sugar control after surgery is 80-180 mg/dL.        WHAT DO I DO ABOUT MY DIABETES MEDICATION?   Marland Kitchen Do not take oral diabetes medicines (pills) the morning of surgery.  Celesta Gentile)  . THE NIGHT BEFORE SURGERY, take ________10__ units of ______NPH_____insulin.     . THE MORNING OF SURGERY, take __________35___ units of _____NPH_____insulin.      Do not wear jewelry.  Do not wear lotions, powders, or perfumes, or deodorant.  Do not shave 48 hours prior to surgery.  Men may shave face and neck.  Do not bring valuables to the hospital.  [facility] is not responsible for any belongings or valuables.  Contacts, dentures or bridgework may not be worn into surgery.  Leave your suitcase in the car.  After surgery it may be brought to your room.  For patients admitted to the hospital, discharge time will be determined by your treatment team.  Patients discharged the day of surgery will not be allowed to drive home.    Special instructions:  [facility]- Preparing For Surgery  Before surgery, you can play an important role. Because skin is not sterile, your skin needs to be as free of germs as possible. You can reduce the number of germs on your skin by washing with CHG (chlorahexidine gluconate) Soap before surgery.  CHG is an antiseptic cleaner which kills germs and bonds with the skin to continue  killing germs even after washing.  Oral Hygiene is also important to reduce your risk of infection.  Remember - BRUSH YOUR TEETH THE MORNING OF SURGERY WITH YOUR REGULAR TOOTHPASTE  Please do not use if you have an allergy to CHG or antibacterial soaps. If your skin becomes reddened/irritated stop using the CHG.  Do not shave (including legs and underarms) for at least 48 hours prior to first CHG shower. It is OK to shave your face.  Please follow these instructions carefully.   1. Shower the NIGHT BEFORE SURGERY and the MORNING OF SURGERY with CHG.   2. If you chose to wash your hair, wash your hair first as usual with your normal shampoo.  3. After you shampoo, rinse your hair and body thoroughly to remove the shampoo.  4. Use CHG as you would any other liquid soap. You can apply CHG directly to the skin and wash gently with a scrungie or a clean washcloth.   5. Apply the CHG Soap to your body ONLY FROM THE NECK DOWN.  Do not use on open wounds or open sores. Avoid contact with your eyes, ears, mouth and genitals (private parts). Wash Face and genitals (private parts)  with your normal soap.  6. Wash thoroughly, paying special attention to the area where your surgery will be performed.  7. Thoroughly rinse your body with warm water from the neck down.  8. DO NOT shower/wash with your normal soap after using and rinsing off the CHG Soap.  9. Pat yourself dry with a CLEAN TOWEL.  10. Wear CLEAN PAJAMAS to bed the night before surgery, wear comfortable clothes the morning of surgery  11. Place CLEAN SHEETS on your bed the night of your first shower and DO NOT SLEEP WITH PETS.    Day of Surgery:  Do not apply any deodorants/lotions.  Please wear clean clothes to the hospital/surgery center.   Remember to brush your teeth WITH YOUR REGULAR TOOTHPASTE.    Please read over the following fact sheets that you were given.

## 2020-07-20 ENCOUNTER — Telehealth: Payer: Self-pay | Admitting: Orthopaedic Surgery

## 2020-07-20 NOTE — Telephone Encounter (Signed)
Called patient to let him know his surgery on 07-29-20 is cancelled due to A1-c being to high.  He wishes to remain active with bowling and is requesting prescription for pain to help through the tournaments. He will be followed by Dr. Suzette Battiest to controll the diabetes and would like to try rescheduling his surgery sometime after the new year.   Patient uses [facility].

## 2020-07-22 ENCOUNTER — Inpatient Hospital Stay (HOSPITAL_COMMUNITY)
Admission: RE | Admit: 2020-07-22 | Discharge: 2020-07-22 | Disposition: A | Discharge status: Home / Self Care | Payer: Medicare HMO | Source: Ambulatory Visit

## 2020-07-22 ENCOUNTER — Other Ambulatory Visit: Payer: Self-pay | Admitting: Orthopaedic Surgery

## 2020-07-22 NOTE — Progress Notes (Signed)
Patient's surgery was cancelled by MD.

## 2020-07-22 NOTE — Telephone Encounter (Signed)
Please advise 

## 2020-07-22 NOTE — Pre-Procedure Instructions (Signed)
DEMARRION MEIKLEJOHN  07/22/2020      [id number] - [place], [place] [address] [place] [id number] [place] [address] [place] [address] Phone: [phone] Fax: [phone]    Your procedure is scheduled on Oct. 25  Report to [facility] Entrance A at 10:30 A.M.  Call this number if you have problems the morning of surgery:  [phone]   Remember:  Do not eat or drink after midnight. Nancy Fetter   You may drink clear liquids until 9:30.  Clear liquids allowed are:                    Water, Juice (non-citric and without pulp - diabetics please choose diet or no sugar options), Carbonated beverages - (diabetics please choose diet or no sugar options), Clear Tea, Black Coffee only (no creamer, milk or cream including half and half), Plain Jell-O only (diabetics please choose diet or no sugar options), Gatorade (diabetics please choose diet or no sugar options) and Plain Popsicles only                        Enhanced Recovery after Surgery for Orthopedics Enhanced Recovery after Surgery is a protocol used to improve the stress on your body and your recovery after surgery.  Patient Instructions  . The night before surgery:  o No food after midnight. ONLY clear liquids after midnight  .  Marland Kitchen The day of surgery (if you do NOT have diabetes):  o Drink ONE (1) Pre-Surgery Clear Ensure by _____ am the morning of surgery   o This drink was given to you during your hospital  pre-op appointment visit. o Nothing else to drink after completing the  Pre-Surgery Clear Ensure.  . The day of surgery (if you have diabetes): o  o Drink ONE (1) Gatorade 2 (G2) by __9:30 AM___ the morning of surgery o This drink was given to you during your hospital  pre-op appointment visit.  o Color of the Gatorade may vary. Red is not allowed. o Nothing else to drink after completing the  Gatorade 2 (G2).         If you have questions, please contact your surgeon's  office.     Take these medicines the morning of surgery with A SIP OF WATER :              Amlodipine (norvasc)             Hydrocodone if needed             Omeprazole (prilosec)             Rosuvastatin (crestor)              7 days prior to surgery STOP taking any Aspirin (unless otherwise instructed by your surgeon), Aleve, Naproxen, Ibuprofen, Motrin, Advil, Goody's, BC's, all herbal medications, fish oil, and all vitamins.                  Follow your surgeon's instructions on when to stop Plavix.  If no instructions were given by your surgeon then you will need to call the office to get those instructions.  How to Manage Your Diabetes Before and After Surgery  Why is it important to control my blood sugar before and after surgery? . Improving blood sugar levels before and after surgery helps healing and can limit problems. . A way of improving blood sugar control is eating a healthy diet by: o  Eating less sugar and carbohydrates o  Increasing activity/exercise o  Talking with your doctor about reaching your blood sugar goals . High blood sugars (greater than 180 mg/dL) can raise your risk of infections and slow your recovery, so you will need to focus on controlling your diabetes during the weeks before surgery. . Make sure that the doctor who takes care of your diabetes knows about your planned surgery including the date and location.  How do I manage my blood sugar before surgery? . Check your blood sugar at least 4 times a day, starting 2 days before surgery, to make sure that the level is not too high or low. o Check your blood sugar the morning of your surgery when you wake up and every 2 hours until you get to the Short Stay unit. . If your blood sugar is less than 70 mg/dL, you will need to treat for low blood sugar: o Do not take insulin. o Treat a low blood sugar (less than 70 mg/dL) with  cup of clear juice  (cranberry or apple), 4 glucose tablets, OR glucose gel. Recheck blood sugar in 15 minutes after treatment (to make sure it is greater than 70 mg/dL). If your blood sugar is not greater than 70 mg/dL on recheck, call [phone] o  for further instructions. . Report your blood sugar to the short stay nurse when you get to Short Stay.  . If you are admitted to the hospital after surgery: o Your blood sugar will be checked by the staff and you will probably be given insulin after surgery (instead of oral diabetes medicines) to make sure you have good blood sugar levels. o The goal for blood sugar control after surgery is 80-180 mg/dL.        WHAT DO I DO ABOUT MY DIABETES MEDICATION?   Marland Kitchen Do not take oral diabetes medicines (pills) the morning of surgery.  (Januvia)  . THE NIGHT BEFORE SURGERY, take ________14__ units of ______NPH_____insulin.     THE MORNING OF SURGERY, do not take NPH inuslin unless your cbg is greater than 220, then you may take 1/2 of your sliding scale dose.    Do not wear jewelry.  Do not wear lotions, powders, or cologne, or deodorant.  Men may shave face and neck.  Do not bring valuables to the hospital.  [facility] is not responsible for any belongings or valuables.  Contacts, dentures or bridgework may not be worn into surgery.  Leave your suitcase in the car.  After surgery it may be brought to your room.  For patients admitted to the hospital, discharge time will be determined by your treatment team.  Patients discharged the day of surgery will not be allowed to drive home.    Special instructions:  [place]- Preparing For Surgery  Before surgery, you can play an important role. Because skin is not sterile, your skin needs to be as free of germs as possible. You can reduce the number of germs on your skin by washing with CHG (chlorahexidine gluconate) Soap before surgery.  CHG is an antiseptic cleaner which kills germs and bonds with the skin to  continue killing germs even  after washing.    Oral Hygiene is also important to reduce your risk of infection.  Remember - BRUSH YOUR TEETH THE MORNING OF SURGERY WITH YOUR REGULAR TOOTHPASTE  Please do not use if you have an allergy to CHG or antibacterial soaps. If your skin becomes reddened/irritated stop using the CHG.  Do not shave (including legs and underarms) for at least 48 hours prior to first CHG shower. It is OK to shave your face.  Please follow these instructions carefully.   1. Shower the NIGHT BEFORE SURGERY [facility] OF SURGERY Mon with CHG.   2. If you chose to wash your hair, wash your hair first as usual with your normal shampoo.  3. After you shampoo, rinse your hair and body thoroughly to remove the shampoo.  4. Use CHG as you would any other liquid soap. You can apply CHG directly to the skin and wash gently with a scrungie or a clean washcloth.   5. Apply the CHG Soap to your body ONLY FROM THE NECK DOWN.  Do not use on open wounds or open sores. Avoid contact with your eyes, ears, mouth and genitals (private parts). Wash Face and genitals (private parts)  with your normal soap.  6. Wash thoroughly, paying special attention to the area where your surgery will be performed.  7. Thoroughly rinse your body with warm water from the neck down.  8. DO NOT shower/wash with your normal soap after using and rinsing off the CHG Soap.  9. Pat yourself dry with a CLEAN TOWEL.  10. Wear CLEAN PAJAMAS to bed the night before surgery, wear comfortable clothes the morning of surgery  11. Place CLEAN SHEETS on your bed the night of your first shower and DO NOT SLEEP WITH PETS.    Day of Surgery:  Do not apply any deodorants/lotions.  Please wear clean clothes to the hospital/surgery center.   Remember to brush your teeth WITH YOUR REGULAR TOOTHPASTE.    Please read over the following fact sheets that you were given.

## 2020-07-23 ENCOUNTER — Other Ambulatory Visit: Payer: Self-pay | Admitting: Orthopaedic Surgery

## 2020-07-23 MED ORDER — TRAMADOL HCL 50 MG PO TABS: 50.0000 mg | ORAL_TABLET | Freq: Four times a day (QID) | ORAL | 0 refills | Status: DC | PRN

## 2020-07-23 NOTE — Progress Notes (Signed)
Ultram sent in , I called and discussed. Had 180 norco last year, I discussed he does not want to take strong pain meds so that pain meds work after surgery. He stated A1C was good 6 wks ago and he does not know how it got to 9.

## 2020-07-25 ENCOUNTER — Other Ambulatory Visit (HOSPITAL_COMMUNITY): Payer: Medicare HMO

## 2020-07-29 ENCOUNTER — Ambulatory Visit: Admit: 2020-07-29 | Payer: Medicare HMO | Admitting: Orthopaedic Surgery

## 2020-07-29 SURGERY — HEMI-MICRODISCECTOMY LUMBAR LAMINECTOMY LEVEL 1
Anesthesia: General

## 2020-08-06 ENCOUNTER — Inpatient Hospital Stay: Payer: Medicare HMO | Admitting: Orthopaedic Surgery

## 2020-08-28 ENCOUNTER — Other Ambulatory Visit (HOSPITAL_COMMUNITY): Payer: Self-pay | Admitting: Family

## 2020-08-28 DIAGNOSIS — U071 COVID-19: Secondary | ICD-10-CM

## 2020-08-28 NOTE — Progress Notes (Signed)
I connected by phone with Peter Walker on 08/28/2020 at 6:08 PM to discuss the potential use of a new treatment for mild to moderate COVID-19 viral infection in non-hospitalized patients.  This patient is a 74 y.o. male that meets the FDA criteria for Emergency Use Authorization of COVID monoclonal antibody casirivimab/imdevimab, bamlanivimab/eteseviamb, or sotrovimab.  Has a (+) direct SARS-CoV-2 viral test result  Has mild or moderate COVID-19   Is NOT hospitalized due to COVID-19  Is within 10 days of symptom onset  Has at least one of the high risk factor(s) for progression to severe COVID-19 and/or hospitalization as defined in EUA.  Specific high risk criteria : Older age (>/= 74 yo), Diabetes and Cardiovascular disease or hypertension   Symptoms of chills, nausea, and diarrhea began 08/23/20.   I have spoken and communicated the following to the patient or parent/caregiver regarding COVID monoclonal antibody treatment:  1. FDA has authorized the emergency use for the treatment of mild to moderate COVID-19 in adults and pediatric patients with positive results of direct SARS-CoV-2 viral testing who are 71 years of age and older weighing at least 40 kg, and who are at high risk for progressing to severe COVID-19 and/or hospitalization.  2. The significant known and potential risks and benefits of COVID monoclonal antibody, and the extent to which such potential risks and benefits are unknown.  3. Information on available alternative treatments and the risks and benefits of those alternatives, including clinical trials.  4. Patients treated with COVID monoclonal antibody should continue to self-isolate and use infection control measures (e.g., wear mask, isolate, social distance, avoid sharing personal items, clean and disinfect "high touch" surfaces, and frequent handwashing) according to CDC guidelines.   5. The patient or parent/caregiver has the option to accept or refuse COVID  monoclonal antibody treatment.  After reviewing this information with the patient, the patient has agreed to receive one of the available covid 19 monoclonal antibodies and will be provided an appropriate fact sheet prior to infusion. Asencion Gowda, NP 08/28/2020 6:08 PM

## 2020-08-30 ENCOUNTER — Other Ambulatory Visit (HOSPITAL_COMMUNITY): Payer: Self-pay

## 2020-08-30 ENCOUNTER — Ambulatory Visit (HOSPITAL_COMMUNITY)
Admission: RE | Admit: 2020-08-30 | Discharge: 2020-08-30 | Disposition: A | Discharge status: Home / Self Care | Payer: Medicare HMO | Source: Ambulatory Visit | Attending: Pulmonary Disease | Admitting: Pulmonary Disease

## 2020-08-30 DIAGNOSIS — Z23 Encounter for immunization: Secondary | ICD-10-CM | POA: Insufficient documentation

## 2020-08-30 DIAGNOSIS — U071 COVID-19: Secondary | ICD-10-CM | POA: Insufficient documentation

## 2020-08-30 MED ORDER — METHYLPREDNISOLONE SODIUM SUCC 125 MG IJ SOLR: 125.0000 mg | Freq: Once | INTRAMUSCULAR | Status: DC | PRN

## 2020-08-30 MED ORDER — DIPHENHYDRAMINE HCL 50 MG/ML IJ SOLN: 50.0000 mg | Freq: Once | INTRAMUSCULAR | Status: DC | PRN

## 2020-08-30 MED ORDER — SODIUM CHLORIDE 0.9 % IV SOLN: INTRAVENOUS | Status: DC | PRN

## 2020-08-30 MED ORDER — SOTROVIMAB 500 MG/8ML IV SOLN
500.0000 mg | Freq: Once | INTRAVENOUS | Status: AC
  Administered 2020-08-30: 500 mg via INTRAVENOUS

## 2020-08-30 MED ORDER — ALBUTEROL SULFATE HFA 108 (90 BASE) MCG/ACT IN AERS: 2.0000 | INHALATION_SPRAY | Freq: Once | RESPIRATORY_TRACT | Status: DC | PRN

## 2020-08-30 MED ORDER — EPINEPHRINE 0.3 MG/0.3ML IJ SOAJ: 0.3000 mg | Freq: Once | INTRAMUSCULAR | Status: DC | PRN

## 2020-08-30 MED ORDER — FAMOTIDINE IN NACL 20-0.9 MG/50ML-% IV SOLN: 20.0000 mg | Freq: Once | INTRAVENOUS | Status: DC | PRN

## 2020-08-30 NOTE — Progress Notes (Signed)
Diagnosis: COVID-19  Physician: Dr. Patrick Wright  Procedure: Covid Infusion Clinic Med: Sotrovimab infusion - Provided patient with sotrovimab fact sheet for patients, parents, and caregivers prior to infusion.   Complications: No immediate complications noted  Discharge: Discharged home    

## 2020-08-30 NOTE — Discharge Instructions (Signed)

## 2020-08-30 NOTE — Progress Notes (Signed)
Patient reviewed Fact Sheet for Patients, Parents, and Caregivers for Emergency Use Authorization (EUA) of Sotrovimab for the Treatment of Coronavirus. Patient also reviewed and is agreeable to the estimated cost of treatment. Patient is agreeable to proceed.   

## 2020-09-06 ENCOUNTER — Telehealth: Payer: Self-pay | Admitting: Orthopaedic Surgery

## 2020-09-06 NOTE — Telephone Encounter (Signed)
Patient called advised he is recovering from Covid-19 and his hamstrings are so weak. Patient asked if there is an exercise he can do to strengthen his hamstrings? The number to contact patient is [phone]

## 2020-09-06 NOTE — Telephone Encounter (Signed)
Hamstrings weak from nerve being pinched in his back. Once diabetes good and back surgery done will be better.

## 2020-09-06 NOTE — Telephone Encounter (Signed)
Please advise 

## 2020-09-09 NOTE — Telephone Encounter (Signed)
I left voicemail for patient advising. 

## 2020-09-10 DIAGNOSIS — M48062 Spinal stenosis, lumbar region with neurogenic claudication: Secondary | ICD-10-CM | POA: Diagnosis not present

## 2020-09-10 DIAGNOSIS — U071 COVID-19: Secondary | ICD-10-CM | POA: Diagnosis not present

## 2020-09-10 DIAGNOSIS — I10 Essential (primary) hypertension: Secondary | ICD-10-CM | POA: Diagnosis not present

## 2020-09-30 DIAGNOSIS — E1165 Type 2 diabetes mellitus with hyperglycemia: Secondary | ICD-10-CM | POA: Diagnosis not present

## 2020-09-30 DIAGNOSIS — I10 Essential (primary) hypertension: Secondary | ICD-10-CM | POA: Diagnosis not present

## 2020-09-30 DIAGNOSIS — E7800 Pure hypercholesterolemia, unspecified: Secondary | ICD-10-CM | POA: Diagnosis not present

## 2020-09-30 DIAGNOSIS — I739 Peripheral vascular disease, unspecified: Secondary | ICD-10-CM | POA: Diagnosis not present

## 2020-09-30 DIAGNOSIS — E1121 Type 2 diabetes mellitus with diabetic nephropathy: Secondary | ICD-10-CM | POA: Diagnosis not present

## 2020-10-28 ENCOUNTER — Telehealth: Payer: Self-pay

## 2020-10-28 DIAGNOSIS — E1165 Type 2 diabetes mellitus with hyperglycemia: Secondary | ICD-10-CM | POA: Diagnosis not present

## 2020-10-28 NOTE — Telephone Encounter (Signed)
noted 

## 2020-10-28 NOTE — Telephone Encounter (Signed)
Pt called asking if he can make an apt to get scheduled to have an Epidural Shot.   Pt is still working on getting his A1C down in order to schedule surgery with [facility].

## 2020-10-28 NOTE — Telephone Encounter (Signed)
FYI, I called, ESI will shoot his A1C up and he is working on getting it down,,  I left him detailed message about this.

## 2020-10-28 NOTE — Telephone Encounter (Signed)
Please advise 

## 2020-10-31 DIAGNOSIS — E1165 Type 2 diabetes mellitus with hyperglycemia: Secondary | ICD-10-CM | POA: Diagnosis not present

## 2020-12-01 DIAGNOSIS — E1165 Type 2 diabetes mellitus with hyperglycemia: Secondary | ICD-10-CM | POA: Diagnosis not present

## 2020-12-06 DIAGNOSIS — E1165 Type 2 diabetes mellitus with hyperglycemia: Secondary | ICD-10-CM | POA: Diagnosis not present

## 2020-12-06 DIAGNOSIS — E1121 Type 2 diabetes mellitus with diabetic nephropathy: Secondary | ICD-10-CM | POA: Diagnosis not present

## 2020-12-06 DIAGNOSIS — I739 Peripheral vascular disease, unspecified: Secondary | ICD-10-CM | POA: Diagnosis not present

## 2020-12-06 DIAGNOSIS — E7800 Pure hypercholesterolemia, unspecified: Secondary | ICD-10-CM | POA: Diagnosis not present

## 2020-12-06 DIAGNOSIS — I10 Essential (primary) hypertension: Secondary | ICD-10-CM | POA: Diagnosis not present

## 2020-12-12 ENCOUNTER — Other Ambulatory Visit: Payer: Self-pay | Admitting: Orthopaedic Surgery

## 2020-12-19 DIAGNOSIS — M13842 Other specified arthritis, left hand: Secondary | ICD-10-CM | POA: Diagnosis not present

## 2020-12-19 DIAGNOSIS — M13841 Other specified arthritis, right hand: Secondary | ICD-10-CM | POA: Diagnosis not present

## 2020-12-23 ENCOUNTER — Other Ambulatory Visit: Payer: Self-pay | Admitting: Orthopaedic Surgery

## 2020-12-24 ENCOUNTER — Other Ambulatory Visit: Payer: Self-pay | Admitting: Orthopaedic Surgery

## 2020-12-29 DIAGNOSIS — E1165 Type 2 diabetes mellitus with hyperglycemia: Secondary | ICD-10-CM | POA: Diagnosis not present

## 2021-01-29 DIAGNOSIS — E1165 Type 2 diabetes mellitus with hyperglycemia: Secondary | ICD-10-CM | POA: Diagnosis not present

## 2021-02-05 DIAGNOSIS — E1165 Type 2 diabetes mellitus with hyperglycemia: Secondary | ICD-10-CM | POA: Diagnosis not present

## 2021-02-05 DIAGNOSIS — I739 Peripheral vascular disease, unspecified: Secondary | ICD-10-CM | POA: Diagnosis not present

## 2021-02-05 DIAGNOSIS — I10 Essential (primary) hypertension: Secondary | ICD-10-CM | POA: Diagnosis not present

## 2021-02-05 DIAGNOSIS — E7800 Pure hypercholesterolemia, unspecified: Secondary | ICD-10-CM | POA: Diagnosis not present

## 2021-02-05 DIAGNOSIS — E1121 Type 2 diabetes mellitus with diabetic nephropathy: Secondary | ICD-10-CM | POA: Diagnosis not present

## 2021-02-28 DIAGNOSIS — E1165 Type 2 diabetes mellitus with hyperglycemia: Secondary | ICD-10-CM | POA: Diagnosis not present

## 2021-03-31 DIAGNOSIS — E1165 Type 2 diabetes mellitus with hyperglycemia: Secondary | ICD-10-CM | POA: Diagnosis not present

## 2021-04-30 DIAGNOSIS — E1165 Type 2 diabetes mellitus with hyperglycemia: Secondary | ICD-10-CM | POA: Diagnosis not present

## 2021-06-10 DIAGNOSIS — E1165 Type 2 diabetes mellitus with hyperglycemia: Secondary | ICD-10-CM | POA: Diagnosis not present

## 2021-06-23 ENCOUNTER — Other Ambulatory Visit (HOSPITAL_BASED_OUTPATIENT_CLINIC_OR_DEPARTMENT_OTHER): Payer: Self-pay | Admitting: Family Medicine

## 2021-06-23 ENCOUNTER — Ambulatory Visit (HOSPITAL_BASED_OUTPATIENT_CLINIC_OR_DEPARTMENT_OTHER)
Admission: RE | Admit: 2021-06-23 | Discharge: 2021-06-23 | Disposition: A | Discharge status: Home / Self Care | Payer: Medicare HMO | Source: Ambulatory Visit | Attending: Family Medicine | Admitting: Family Medicine

## 2021-06-23 ENCOUNTER — Other Ambulatory Visit: Payer: Self-pay

## 2021-06-23 DIAGNOSIS — L539 Erythematous condition, unspecified: Secondary | ICD-10-CM | POA: Diagnosis not present

## 2021-06-23 DIAGNOSIS — R600 Localized edema: Secondary | ICD-10-CM | POA: Diagnosis not present

## 2021-06-23 DIAGNOSIS — M7989 Other specified soft tissue disorders: Secondary | ICD-10-CM | POA: Insufficient documentation

## 2021-06-23 DIAGNOSIS — E1165 Type 2 diabetes mellitus with hyperglycemia: Secondary | ICD-10-CM | POA: Diagnosis not present

## 2021-07-22 DIAGNOSIS — E1165 Type 2 diabetes mellitus with hyperglycemia: Secondary | ICD-10-CM | POA: Diagnosis not present

## 2021-07-22 DIAGNOSIS — G629 Polyneuropathy, unspecified: Secondary | ICD-10-CM | POA: Diagnosis not present

## 2021-07-31 DIAGNOSIS — Z794 Long term (current) use of insulin: Secondary | ICD-10-CM | POA: Diagnosis not present

## 2021-07-31 DIAGNOSIS — M48062 Spinal stenosis, lumbar region with neurogenic claudication: Secondary | ICD-10-CM | POA: Diagnosis not present

## 2021-07-31 DIAGNOSIS — Z0000 Encounter for general adult medical examination without abnormal findings: Secondary | ICD-10-CM | POA: Diagnosis not present

## 2021-07-31 DIAGNOSIS — I739 Peripheral vascular disease, unspecified: Secondary | ICD-10-CM | POA: Diagnosis not present

## 2021-07-31 DIAGNOSIS — Z79899 Other long term (current) drug therapy: Secondary | ICD-10-CM | POA: Diagnosis not present

## 2021-07-31 DIAGNOSIS — I10 Essential (primary) hypertension: Secondary | ICD-10-CM | POA: Diagnosis not present

## 2021-07-31 DIAGNOSIS — E785 Hyperlipidemia, unspecified: Secondary | ICD-10-CM | POA: Diagnosis not present

## 2021-07-31 DIAGNOSIS — E1165 Type 2 diabetes mellitus with hyperglycemia: Secondary | ICD-10-CM | POA: Diagnosis not present

## 2021-07-31 DIAGNOSIS — N182 Chronic kidney disease, stage 2 (mild): Secondary | ICD-10-CM | POA: Diagnosis not present

## 2021-07-31 DIAGNOSIS — G894 Chronic pain syndrome: Secondary | ICD-10-CM | POA: Diagnosis not present

## 2021-07-31 DIAGNOSIS — Z1211 Encounter for screening for malignant neoplasm of colon: Secondary | ICD-10-CM | POA: Diagnosis not present

## 2021-08-08 ENCOUNTER — Other Ambulatory Visit: Payer: Self-pay | Admitting: Family Medicine

## 2021-08-08 DIAGNOSIS — R42 Dizziness and giddiness: Secondary | ICD-10-CM

## 2021-08-09 ENCOUNTER — Other Ambulatory Visit: Payer: Self-pay | Admitting: Vascular Surgery

## 2021-08-11 ENCOUNTER — Other Ambulatory Visit: Payer: Self-pay

## 2021-08-11 ENCOUNTER — Other Ambulatory Visit: Payer: Self-pay | Admitting: Vascular Surgery

## 2021-08-11 MED ORDER — CLOPIDOGREL BISULFATE 75 MG PO TABS: 75.0000 mg | ORAL_TABLET | Freq: Every day | ORAL | 0 refills | Status: DC

## 2021-08-21 ENCOUNTER — Other Ambulatory Visit: Payer: Self-pay | Admitting: Orthopaedic Surgery

## 2021-08-21 ENCOUNTER — Telehealth: Payer: Self-pay | Admitting: Orthopaedic Surgery

## 2021-08-21 NOTE — Telephone Encounter (Signed)
Pt called requesting a called requesting a call back from Dr. Lorin Mercy. Pt has not been in office since 2021 but has a few medical questions for Dr. Lorin Mercy. Please call pt at [phone].

## 2021-08-21 NOTE — Telephone Encounter (Signed)
I called patient and advised. 

## 2021-08-21 NOTE — Telephone Encounter (Signed)
Please advise 

## 2021-08-22 DIAGNOSIS — G4700 Insomnia, unspecified: Secondary | ICD-10-CM | POA: Diagnosis not present

## 2021-08-22 DIAGNOSIS — N182 Chronic kidney disease, stage 2 (mild): Secondary | ICD-10-CM | POA: Diagnosis not present

## 2021-08-22 DIAGNOSIS — E1121 Type 2 diabetes mellitus with diabetic nephropathy: Secondary | ICD-10-CM | POA: Diagnosis not present

## 2021-08-22 DIAGNOSIS — E038 Other specified hypothyroidism: Secondary | ICD-10-CM | POA: Diagnosis not present

## 2021-08-22 DIAGNOSIS — R69 Illness, unspecified: Secondary | ICD-10-CM | POA: Diagnosis not present

## 2021-08-22 DIAGNOSIS — M1990 Unspecified osteoarthritis, unspecified site: Secondary | ICD-10-CM | POA: Diagnosis not present

## 2021-08-22 DIAGNOSIS — I10 Essential (primary) hypertension: Secondary | ICD-10-CM | POA: Diagnosis not present

## 2021-08-22 DIAGNOSIS — E785 Hyperlipidemia, unspecified: Secondary | ICD-10-CM | POA: Diagnosis not present

## 2021-08-22 DIAGNOSIS — E1122 Type 2 diabetes mellitus with diabetic chronic kidney disease: Secondary | ICD-10-CM | POA: Diagnosis not present

## 2021-09-02 ENCOUNTER — Ambulatory Visit
Admission: RE | Admit: 2021-09-02 | Discharge: 2021-09-02 | Disposition: A | Discharge status: Home / Self Care | Payer: Medicare HMO | Source: Ambulatory Visit | Attending: Family Medicine | Admitting: Family Medicine

## 2021-09-02 DIAGNOSIS — G319 Degenerative disease of nervous system, unspecified: Secondary | ICD-10-CM | POA: Diagnosis not present

## 2021-09-02 DIAGNOSIS — I6782 Cerebral ischemia: Secondary | ICD-10-CM | POA: Diagnosis not present

## 2021-09-02 DIAGNOSIS — R42 Dizziness and giddiness: Secondary | ICD-10-CM

## 2021-09-02 DIAGNOSIS — I639 Cerebral infarction, unspecified: Secondary | ICD-10-CM | POA: Diagnosis not present

## 2021-09-02 MED ORDER — GADOBENATE DIMEGLUMINE 529 MG/ML IV SOLN
18.0000 mL | Freq: Once | INTRAVENOUS | Status: AC | PRN
  Administered 2021-09-02: 18 mL via INTRAVENOUS

## 2021-09-04 ENCOUNTER — Other Ambulatory Visit: Payer: Self-pay | Admitting: Vascular Surgery

## 2021-09-05 NOTE — Telephone Encounter (Signed)
Must make appt for refills

## 2021-10-21 DIAGNOSIS — E1122 Type 2 diabetes mellitus with diabetic chronic kidney disease: Secondary | ICD-10-CM | POA: Diagnosis not present

## 2021-10-21 DIAGNOSIS — N182 Chronic kidney disease, stage 2 (mild): Secondary | ICD-10-CM | POA: Diagnosis not present

## 2021-10-21 DIAGNOSIS — E1121 Type 2 diabetes mellitus with diabetic nephropathy: Secondary | ICD-10-CM | POA: Diagnosis not present

## 2021-10-21 DIAGNOSIS — E785 Hyperlipidemia, unspecified: Secondary | ICD-10-CM | POA: Diagnosis not present

## 2021-10-21 DIAGNOSIS — E1165 Type 2 diabetes mellitus with hyperglycemia: Secondary | ICD-10-CM | POA: Diagnosis not present

## 2021-10-21 DIAGNOSIS — I10 Essential (primary) hypertension: Secondary | ICD-10-CM | POA: Diagnosis not present

## 2021-10-21 DIAGNOSIS — G4700 Insomnia, unspecified: Secondary | ICD-10-CM | POA: Diagnosis not present

## 2021-10-21 DIAGNOSIS — E038 Other specified hypothyroidism: Secondary | ICD-10-CM | POA: Diagnosis not present

## 2021-10-23 ENCOUNTER — Other Ambulatory Visit: Payer: Self-pay | Admitting: Vascular Surgery

## 2021-10-24 ENCOUNTER — Other Ambulatory Visit: Payer: Self-pay | Admitting: Vascular Surgery

## 2021-10-26 DIAGNOSIS — I639 Cerebral infarction, unspecified: Secondary | ICD-10-CM | POA: Insufficient documentation

## 2021-10-26 DIAGNOSIS — I10 Essential (primary) hypertension: Secondary | ICD-10-CM | POA: Insufficient documentation

## 2021-10-26 DIAGNOSIS — E118 Type 2 diabetes mellitus with unspecified complications: Secondary | ICD-10-CM | POA: Insufficient documentation

## 2021-10-26 NOTE — Progress Notes (Deleted)
Cardiology Office Note   Date:  10/26/2021   ID:  Blanche, Gallien [birth year], MRN [id number]  PCP:  Jonathon Jordan, MD  Cardiologist:   None Referring:  ***  No chief complaint on file.     History of Present Illness: Peter Walker is a 76 y.o. male who presents for evaluation of CVA.  He has PVD.    ***      Past Medical History:  Diagnosis Date   Aneurysm, common iliac artery ([place]) 02/23/2001   no hx of CIA aneurysm seen 09/17/16, but has PAD with iliac occlusive disease; Left  and Severe claudication Left leg  secondary to  iliac occlusive  disease 03/06/2004   Degenerative joint disease    Diabetes mellitus    Type II   Hypertension    PAD (peripheral artery disease) ([place])    left CIA stents '02, '05, right CIA stent '03    Past Surgical History:  Procedure Laterality Date   ANGIOPLASTY     common iliac artery L '02, '05; R '03   LOWER EXTREMITY ANGIOGRAPHY  03/25/2017   Procedure: Lower Extremity Angiography;  Surgeon: Waynetta Sandy, MD;  Location: [facility];  Service: Cardiovascular;;   PERIPHERAL VASCULAR INTERVENTION Right 03/25/2017   Procedure: Peripheral Vascular Intervention;  Surgeon: Waynetta Sandy, MD;  Location: [facility];  Service: Cardiovascular;  Laterality: Right;  common iliac     Current Outpatient Medications  Medication Sig Dispense Refill   amLODipine (NORVASC) 5 MG tablet Take 5 mg by mouth daily.     ascorbic acid (VITAMIN C) 500 MG tablet Take 500 mg by mouth daily.     celecoxib (CELEBREX) 200 MG capsule Take 200 mg by mouth 2 (two) times daily as needed for mild pain.      clopidogrel (PLAVIX) 75 MG tablet TAKE 1 TABLET BY MOUTH EVERY DAY (Patient taking differently: Take 75 mg by mouth daily. ) 90 tablet 3   clopidogrel (PLAVIX) 75 MG tablet TAKE 1 TABLET(75 MG) BY MOUTH DAILY 30 tablet 0   Coenzyme Q10 (COQ10) 400 MG CAPS Take 400 mg by mouth daily.     diclofenac Sodium (VOLTAREN) 1 % GEL  Apply 1 application topically 4 (four) times daily as needed (pain).     HYDROcodone-acetaminophen (NORCO/VICODIN) 5-325 MG tablet Take 1 tablet by mouth every 6 (six) hours as needed for moderate pain.      insulin NPH-regular Human (70-30) 100 UNIT/ML injection Inject 20-70 Units into the skin See admin instructions. Inject 70 units in the morning and 20 units in the evening     JANUVIA 100 MG tablet Take 100 mg by mouth daily.      losartan-hydrochlorothiazide (HYZAAR) 100-12.5 MG tablet Take 1 tablet by mouth daily.      Naproxen Sod-diphenhydrAMINE (ALEVE PM) 220-25 MG TABS Take 2 tablets by mouth at bedtime as needed (sleep).     omeprazole (PRILOSEC) 20 MG capsule Take 20 mg by mouth daily.     RELION INSULIN SYRINGE 1ML/31G 31G X 5/16" 1 ML MISC      rosuvastatin (CRESTOR) 20 MG tablet Take 20 mg by mouth daily.   3   traMADol (ULTRAM) 50 MG tablet Take 1 tablet (50 mg total) by mouth every 6 (six) hours as needed. 40 tablet 0   No current facility-administered medications for this visit.    Allergies:   Patient has no known allergies.    Social History:  The patient  reports that he quit smoking about 17 years ago. His smoking use included cigarettes. He has a 74.00 pack-year smoking history. He has never used smokeless tobacco. He reports that he does not drink alcohol and does not use drugs.   Family History:  The patient's ***family history includes Diabetes in his father and mother; Heart disease in his father and mother; Hyperlipidemia in his father; Hypertension in his father.    ROS:  Please see the history of present illness.   Otherwise, review of systems are positive for {NONE DEFAULTED:18576}.   All other systems are reviewed and negative.    PHYSICAL EXAM: VS:  There were no vitals taken for this visit. , BMI There is no height or weight on file to calculate BMI. GENERAL:  Well appearing HEENT:  Pupils equal round and reactive, fundi not visualized, oral mucosa  unremarkable NECK:  No jugular venous distention, waveform within normal limits, carotid upstroke brisk and symmetric, no bruits, no thyromegaly LYMPHATICS:  No cervical, inguinal adenopathy LUNGS:  Clear to auscultation bilaterally BACK:  No CVA tenderness CHEST:  Unremarkable HEART:  PMI not displaced or sustained,S1 and S2 within normal limits, no S3, no S4, no clicks, no rubs, *** murmurs ABD:  Flat, positive bowel sounds normal in frequency in pitch, no bruits, no rebound, no guarding, no midline pulsatile mass, no hepatomegaly, no splenomegaly EXT:  2 plus pulses throughout, no edema, no cyanosis no clubbing SKIN:  No rashes no nodules NEURO:  Cranial nerves II through XII grossly intact, motor grossly intact throughout PSYCH:  Cognitively intact, oriented to person place and time    EKG:  EKG {ACTION; IS/IS TLX:[id number]} ordered today. The ekg ordered today demonstrates ***   Recent Labs: No results found for requested labs within last 8760 hours.    Lipid Panel No results found for: CHOL, TRIG, HDL, CHOLHDL, VLDL, LDLCALC, LDLDIRECT    Wt Readings from Last 3 Encounters:  06/19/20 209 lb 9.6 oz (95.1 kg)  03/12/20 225 lb (102.1 kg)  02/21/20 225 lb (102.1 kg)      Other studies Reviewed: Additional studies/ records that were reviewed today include: ***. Review of the above records demonstrates:  Please see elsewhere in the note.  ***   ASSESSMENT AND PLAN:  CVA:  ***  PVD:  He is followed by Dr. Donzetta Matters.    DM:  ***  HTN:  ***    Current medicines are reviewed at length with the patient today.  The patient {ACTIONS; HAS/DOES NOT HAVE:19233} concerns regarding medicines.  The following changes have been made:  {PLAN; NO CHANGE:13088:s}  Labs/ tests ordered today include: *** No orders of the defined types were placed in this encounter.    Disposition:   FU with ***    Signed, Minus Breeding, MD  10/26/2021 8:12 PM    [facility]

## 2021-10-27 ENCOUNTER — Ambulatory Visit: Payer: Medicare HMO | Admitting: Cardiology

## 2021-10-27 DIAGNOSIS — E118 Type 2 diabetes mellitus with unspecified complications: Secondary | ICD-10-CM

## 2021-10-27 DIAGNOSIS — I10 Essential (primary) hypertension: Secondary | ICD-10-CM

## 2021-10-27 DIAGNOSIS — I639 Cerebral infarction, unspecified: Secondary | ICD-10-CM

## 2021-11-13 DIAGNOSIS — E1122 Type 2 diabetes mellitus with diabetic chronic kidney disease: Secondary | ICD-10-CM | POA: Diagnosis not present

## 2021-11-13 DIAGNOSIS — I10 Essential (primary) hypertension: Secondary | ICD-10-CM | POA: Diagnosis not present

## 2021-11-13 DIAGNOSIS — E1121 Type 2 diabetes mellitus with diabetic nephropathy: Secondary | ICD-10-CM | POA: Diagnosis not present

## 2021-11-13 DIAGNOSIS — N182 Chronic kidney disease, stage 2 (mild): Secondary | ICD-10-CM | POA: Diagnosis not present

## 2021-11-17 DIAGNOSIS — M79642 Pain in left hand: Secondary | ICD-10-CM | POA: Diagnosis not present

## 2021-11-17 DIAGNOSIS — M19041 Primary osteoarthritis, right hand: Secondary | ICD-10-CM | POA: Diagnosis not present

## 2021-11-17 DIAGNOSIS — M19042 Primary osteoarthritis, left hand: Secondary | ICD-10-CM | POA: Diagnosis not present

## 2021-11-17 DIAGNOSIS — M79641 Pain in right hand: Secondary | ICD-10-CM | POA: Diagnosis not present

## 2021-11-24 DIAGNOSIS — I10 Essential (primary) hypertension: Secondary | ICD-10-CM | POA: Diagnosis not present

## 2021-11-24 DIAGNOSIS — E1121 Type 2 diabetes mellitus with diabetic nephropathy: Secondary | ICD-10-CM | POA: Diagnosis not present

## 2021-11-24 DIAGNOSIS — G629 Polyneuropathy, unspecified: Secondary | ICD-10-CM | POA: Diagnosis not present

## 2021-11-24 DIAGNOSIS — E1165 Type 2 diabetes mellitus with hyperglycemia: Secondary | ICD-10-CM | POA: Diagnosis not present

## 2021-11-24 DIAGNOSIS — E7800 Pure hypercholesterolemia, unspecified: Secondary | ICD-10-CM | POA: Diagnosis not present

## 2021-11-24 DIAGNOSIS — I739 Peripheral vascular disease, unspecified: Secondary | ICD-10-CM | POA: Diagnosis not present

## 2021-11-25 ENCOUNTER — Telehealth: Payer: Self-pay | Admitting: Orthopaedic Surgery

## 2021-11-25 NOTE — Telephone Encounter (Signed)
Received call from pt.he requesting copy of records. He states he has appt Thursday with Dr. Nelva Bush. He is coming in to sign release form Wednesday and I will fax records upon receipt of form.

## 2021-11-28 DIAGNOSIS — G5603 Carpal tunnel syndrome, bilateral upper limbs: Secondary | ICD-10-CM | POA: Diagnosis not present

## 2021-11-28 DIAGNOSIS — G5623 Lesion of ulnar nerve, bilateral upper limbs: Secondary | ICD-10-CM | POA: Diagnosis not present

## 2021-12-08 DIAGNOSIS — M79641 Pain in right hand: Secondary | ICD-10-CM | POA: Diagnosis not present

## 2021-12-08 DIAGNOSIS — M19042 Primary osteoarthritis, left hand: Secondary | ICD-10-CM | POA: Diagnosis not present

## 2021-12-08 DIAGNOSIS — M19041 Primary osteoarthritis, right hand: Secondary | ICD-10-CM | POA: Diagnosis not present

## 2021-12-08 DIAGNOSIS — M79642 Pain in left hand: Secondary | ICD-10-CM | POA: Diagnosis not present

## 2022-01-07 DIAGNOSIS — E785 Hyperlipidemia, unspecified: Secondary | ICD-10-CM | POA: Diagnosis not present

## 2022-01-07 DIAGNOSIS — I739 Peripheral vascular disease, unspecified: Secondary | ICD-10-CM | POA: Diagnosis not present

## 2022-01-07 DIAGNOSIS — I10 Essential (primary) hypertension: Secondary | ICD-10-CM | POA: Diagnosis not present

## 2022-01-07 DIAGNOSIS — M48062 Spinal stenosis, lumbar region with neurogenic claudication: Secondary | ICD-10-CM | POA: Diagnosis not present

## 2022-01-07 DIAGNOSIS — R69 Illness, unspecified: Secondary | ICD-10-CM | POA: Diagnosis not present

## 2022-01-07 DIAGNOSIS — E1165 Type 2 diabetes mellitus with hyperglycemia: Secondary | ICD-10-CM | POA: Diagnosis not present

## 2022-01-22 IMAGING — US US EXTREM LOW VENOUS*L*
1 series · 13 of 24 positions shown · non-contrast
Comparison: None.

CLINICAL DATA: Left lower extremity edema.



[Series 1: us venous img lower uni left (dvt) · portal-venous · 13 of 33 slices shown]
[im 1/33]
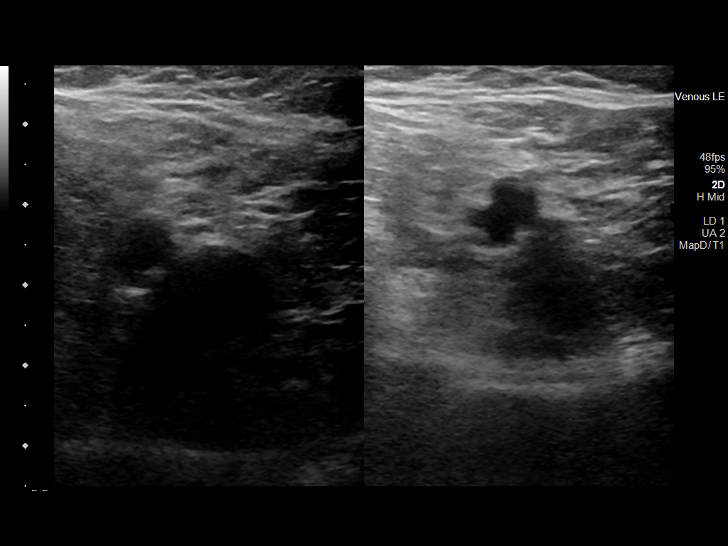
[im 3/33]
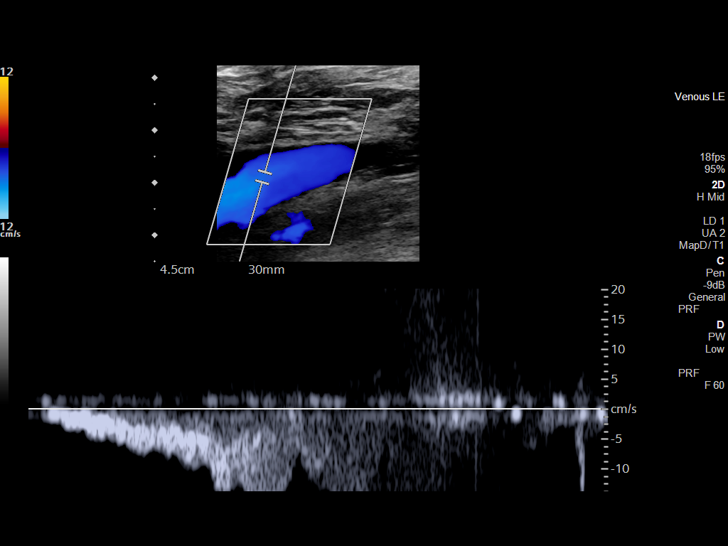
[im 6/33]
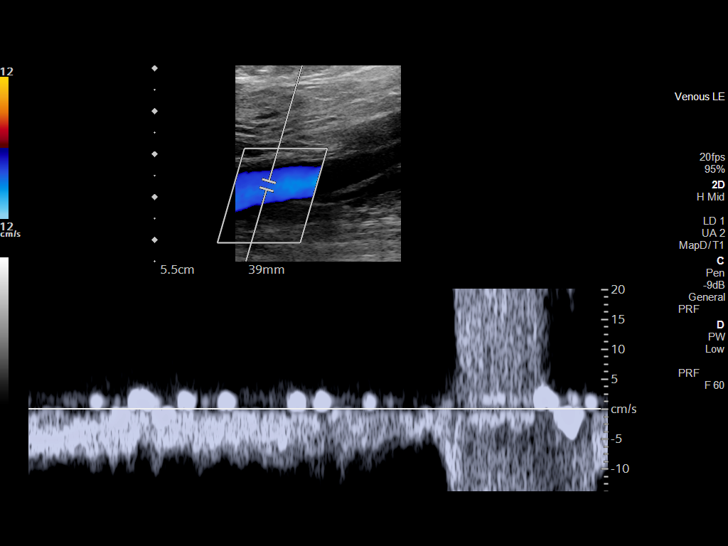
[im 9/33]
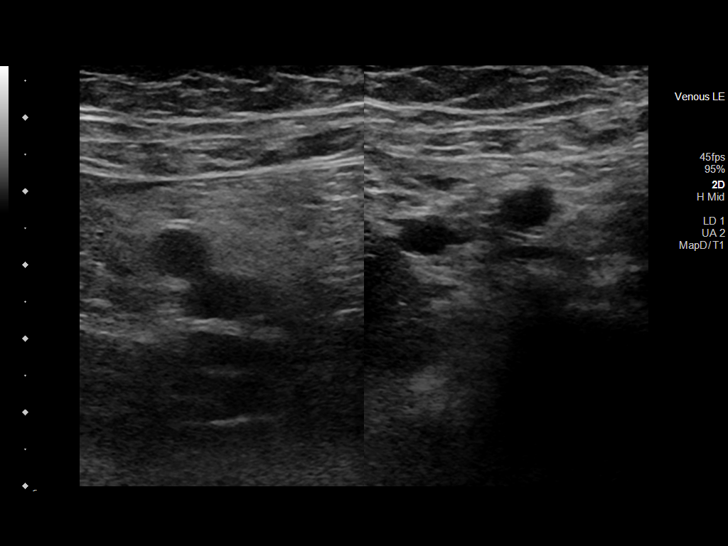
[im 12/33]
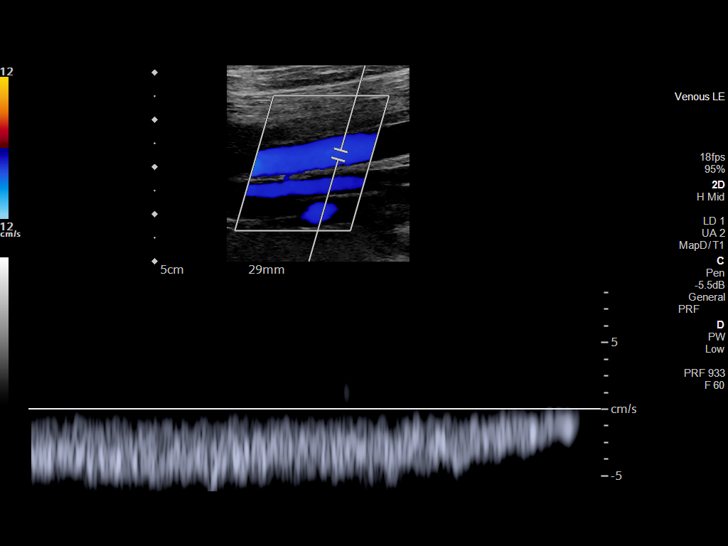
[im 14/33]
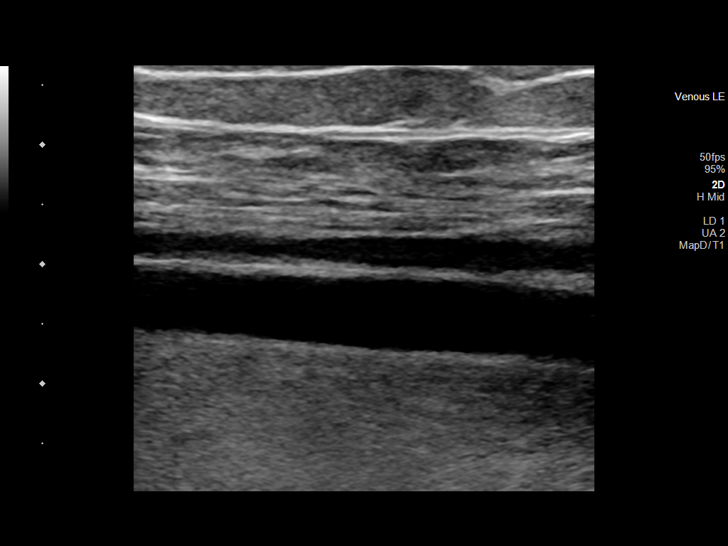
[im 17/33]
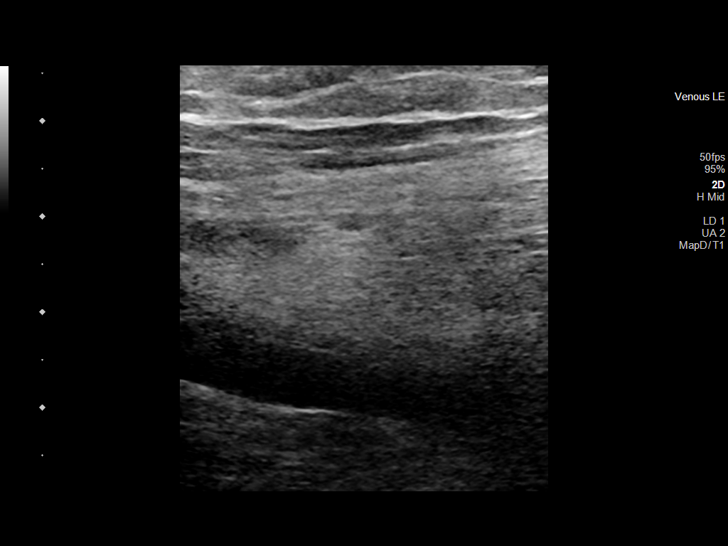
[im 19/33]
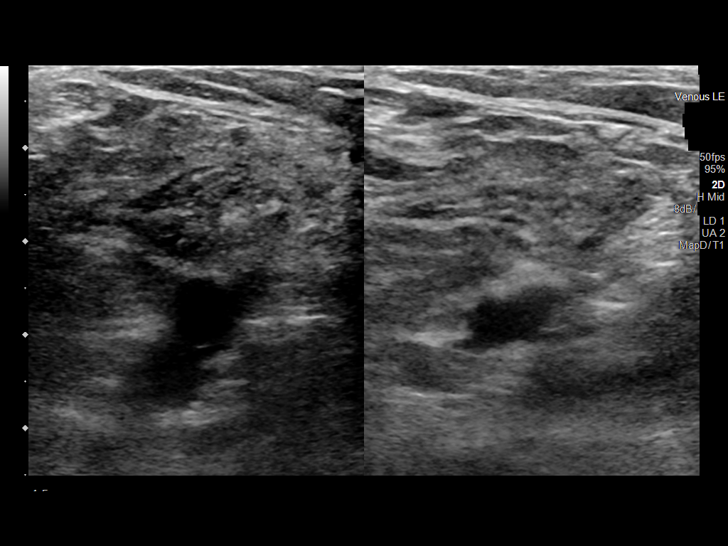
[im 21/33]
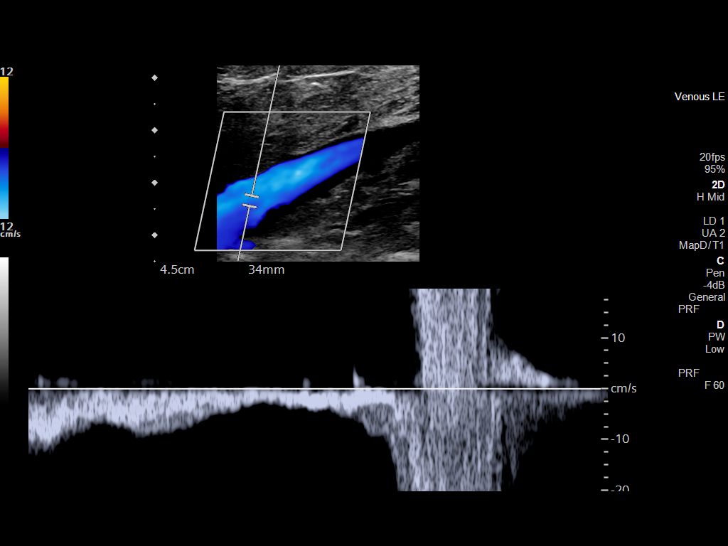
[im 24/33]
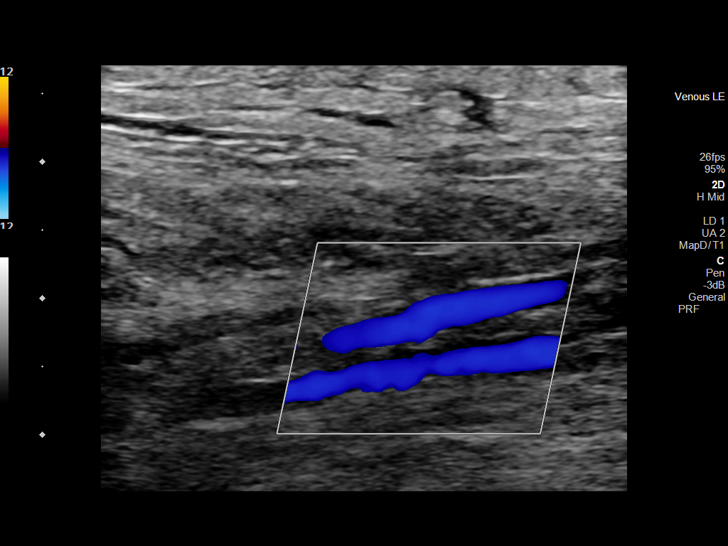
[im 27/33]
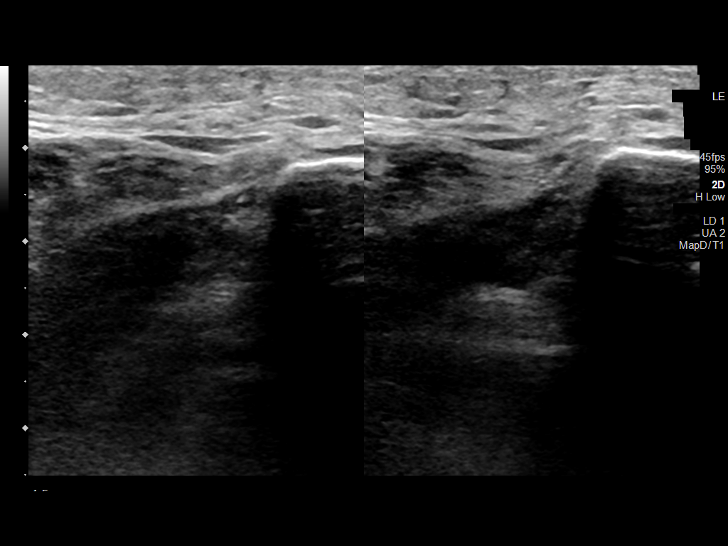
[im 30/33]
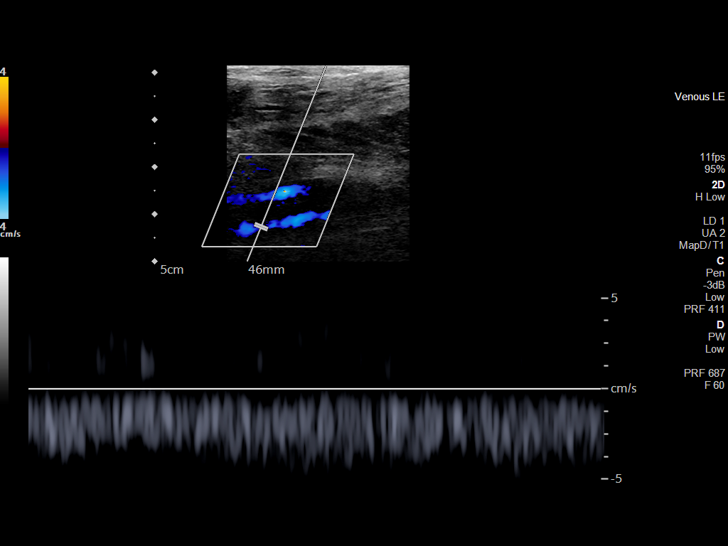
[im 33/33]
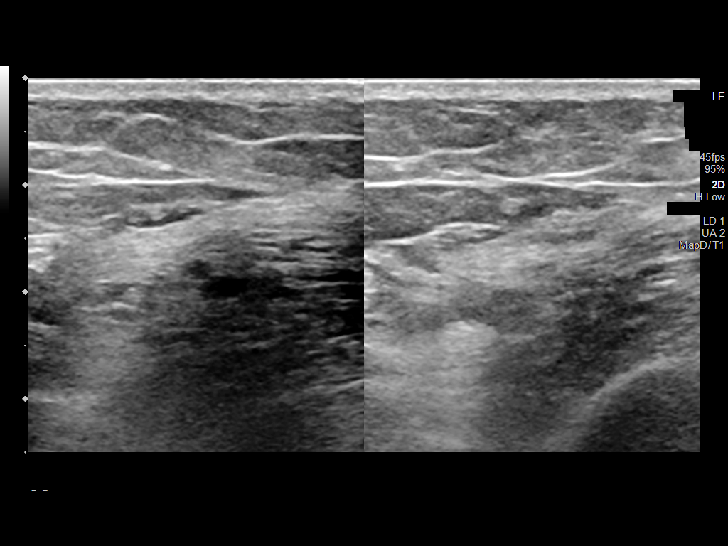

[13 of 24 positions shown; findings below may reference images not displayed]

FINDINGS: Contralateral Common Femoral Vein: Respiratory phasicity is normal
and symmetric with the symptomatic side. No evidence of thrombus.
Normal compressibility.

Common Femoral Vein: No evidence of thrombus. Normal
compressibility, respiratory phasicity and response to augmentation.

Saphenofemoral Junction: No evidence of thrombus. Normal
compressibility and flow on color Doppler imaging.

Profunda Femoral Vein: No evidence of thrombus. Normal
compressibility and flow on color Doppler imaging.

Femoral Vein: No evidence of thrombus. Normal compressibility,
respiratory phasicity and response to augmentation.

Popliteal Vein: No evidence of thrombus. Normal compressibility,
respiratory phasicity and response to augmentation.

Calf Veins: No evidence of thrombus. Normal compressibility and flow
on color Doppler imaging.

Superficial Great Saphenous Vein: No evidence of thrombus. Normal
compressibility.

Venous Reflux:  None.

Other Findings: No evidence of superficial thrombophlebitis or
abnormal fluid collection.
IMPRESSION: No evidence of left lower extremity deep venous thrombosis.

## 2022-02-23 DIAGNOSIS — E1165 Type 2 diabetes mellitus with hyperglycemia: Secondary | ICD-10-CM | POA: Diagnosis not present

## 2022-02-23 DIAGNOSIS — I10 Essential (primary) hypertension: Secondary | ICD-10-CM | POA: Diagnosis not present

## 2022-03-04 DIAGNOSIS — G894 Chronic pain syndrome: Secondary | ICD-10-CM | POA: Diagnosis not present

## 2022-03-04 DIAGNOSIS — I10 Essential (primary) hypertension: Secondary | ICD-10-CM | POA: Diagnosis not present

## 2022-03-04 DIAGNOSIS — E785 Hyperlipidemia, unspecified: Secondary | ICD-10-CM | POA: Diagnosis not present

## 2022-03-04 DIAGNOSIS — N182 Chronic kidney disease, stage 2 (mild): Secondary | ICD-10-CM | POA: Diagnosis not present

## 2022-03-04 DIAGNOSIS — E1121 Type 2 diabetes mellitus with diabetic nephropathy: Secondary | ICD-10-CM | POA: Diagnosis not present

## 2022-03-09 DIAGNOSIS — E785 Hyperlipidemia, unspecified: Secondary | ICD-10-CM | POA: Diagnosis not present

## 2022-03-09 DIAGNOSIS — G894 Chronic pain syndrome: Secondary | ICD-10-CM | POA: Diagnosis not present

## 2022-03-09 DIAGNOSIS — E1121 Type 2 diabetes mellitus with diabetic nephropathy: Secondary | ICD-10-CM | POA: Diagnosis not present

## 2022-03-09 DIAGNOSIS — I10 Essential (primary) hypertension: Secondary | ICD-10-CM | POA: Diagnosis not present

## 2022-03-09 DIAGNOSIS — N182 Chronic kidney disease, stage 2 (mild): Secondary | ICD-10-CM | POA: Diagnosis not present

## 2022-04-08 DIAGNOSIS — G894 Chronic pain syndrome: Secondary | ICD-10-CM | POA: Diagnosis not present

## 2022-04-08 DIAGNOSIS — M48062 Spinal stenosis, lumbar region with neurogenic claudication: Secondary | ICD-10-CM | POA: Diagnosis not present

## 2022-06-13 DIAGNOSIS — R531 Weakness: Secondary | ICD-10-CM | POA: Diagnosis not present

## 2022-06-13 DIAGNOSIS — R42 Dizziness and giddiness: Secondary | ICD-10-CM | POA: Diagnosis not present

## 2022-06-13 DIAGNOSIS — Z743 Need for continuous supervision: Secondary | ICD-10-CM | POA: Diagnosis not present

## 2022-06-13 DIAGNOSIS — N179 Acute kidney failure, unspecified: Secondary | ICD-10-CM | POA: Diagnosis not present

## 2022-06-26 DIAGNOSIS — E1165 Type 2 diabetes mellitus with hyperglycemia: Secondary | ICD-10-CM | POA: Diagnosis not present

## 2022-06-26 DIAGNOSIS — R55 Syncope and collapse: Secondary | ICD-10-CM | POA: Diagnosis not present

## 2022-06-26 DIAGNOSIS — E7800 Pure hypercholesterolemia, unspecified: Secondary | ICD-10-CM | POA: Diagnosis not present

## 2022-06-29 ENCOUNTER — Telehealth: Payer: Self-pay

## 2022-06-29 DIAGNOSIS — I739 Peripheral vascular disease, unspecified: Secondary | ICD-10-CM

## 2022-06-29 NOTE — Telephone Encounter (Signed)
Pt called with c/o weakness and pain in his BLE. He denies any swelling or cold extremities. He states the pain is worse when climbing stairs. There was a recall from his last visit in 2019 for a 1 yr f/u. Pt stated he did not receive any information at that time. Appts for [place] and Dr Donzetta Matters scheduled from recall. Pt instructed to elevate at rest and use compression stockings or ACE wrap. Confirmed understanding.

## 2022-07-03 DIAGNOSIS — N182 Chronic kidney disease, stage 2 (mild): Secondary | ICD-10-CM | POA: Diagnosis not present

## 2022-07-03 DIAGNOSIS — E1122 Type 2 diabetes mellitus with diabetic chronic kidney disease: Secondary | ICD-10-CM | POA: Diagnosis not present

## 2022-07-03 DIAGNOSIS — E1121 Type 2 diabetes mellitus with diabetic nephropathy: Secondary | ICD-10-CM | POA: Diagnosis not present

## 2022-07-03 DIAGNOSIS — I10 Essential (primary) hypertension: Secondary | ICD-10-CM | POA: Diagnosis not present

## 2022-07-03 DIAGNOSIS — E785 Hyperlipidemia, unspecified: Secondary | ICD-10-CM | POA: Diagnosis not present

## 2022-07-09 ENCOUNTER — Telehealth: Payer: Self-pay

## 2022-07-09 NOTE — Telephone Encounter (Signed)
Received a msg from [employer], [employer] that:  "There is a patient you scheduled on 10/11 [place] [id number] for an aortoiliac and ABI. Patient needs a morning appointment, and fasting."  Informed her I would call the pt to facilitate the appt change. She then stated that:  "Actually, I just realized he will be seeing Dr. Donzetta Matters on the same day. We can just tell him to eat a light breakfast and NPO afterwards till his appointment. That way you do not have to find another slot for him to see Doc as well."  Called pt, two identifiers used. Informed pt's wife about the instructions. She stated she would have her husband call to review.  Pt called.  Returned call. Reviewed instructions and coordinated with pt regarding his diabetes medication and food intake. Pt will eat a light breakfast with eggs and monitor blood glucose levels. Pt advised to bring a snack for after [place] studies. Confirmed understanding.

## 2022-07-14 DIAGNOSIS — L249 Irritant contact dermatitis, unspecified cause: Secondary | ICD-10-CM | POA: Diagnosis not present

## 2022-07-15 ENCOUNTER — Ambulatory Visit: Payer: Medicare HMO | Admitting: Vascular Surgery

## 2022-07-15 ENCOUNTER — Ambulatory Visit (HOSPITAL_BASED_OUTPATIENT_CLINIC_OR_DEPARTMENT_OTHER)
Admission: RE | Admit: 2022-07-15 | Discharge: 2022-07-15 | Disposition: A | Discharge status: Home / Self Care | Payer: Medicare HMO | Source: Ambulatory Visit | Attending: Vascular Surgery | Admitting: Vascular Surgery

## 2022-07-15 ENCOUNTER — Ambulatory Visit (HOSPITAL_COMMUNITY)
Admission: RE | Admit: 2022-07-15 | Discharge: 2022-07-15 | Disposition: A | Discharge status: Home / Self Care | Payer: Medicare HMO | Source: Ambulatory Visit | Attending: Vascular Surgery | Admitting: Vascular Surgery

## 2022-07-15 ENCOUNTER — Encounter: Payer: Self-pay | Admitting: Vascular Surgery

## 2022-07-15 VITALS — BP 173/72 | HR 56 | Temp 97.8°F | Resp 20 | Ht 71.0 in | Wt 193.0 lb

## 2022-07-15 DIAGNOSIS — Z95828 Presence of other vascular implants and grafts: Secondary | ICD-10-CM | POA: Diagnosis not present

## 2022-07-15 DIAGNOSIS — I739 Peripheral vascular disease, unspecified: Secondary | ICD-10-CM

## 2022-07-15 NOTE — Progress Notes (Signed)
Patient ID: Peter Walker, male   DOB: 1946-05-27, 76 y.o.   MRN: [id number]  Reason for Consult: Follow-up   Referred by Peter Jordan, MD  Subjective:     HPI:  Peter Walker is a 76 y.o. male with a history of claudication status post bilateral common iliac artery stenting.  He is now significantly lost weight totaling approximately 30 pounds and this was done intentionally.  He has had wounds on the feet in the past and these have healed without incidence.  He does have back pain has been evaluated by spine surgery.  Denies any further claudication symptoms.  No personal or family history of aneurysm disease.  Denies history of stroke, TIA or amaurosis.  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  He also remains active bowling.  Past Medical History:  Diagnosis Date   Aneurysm, common iliac artery ([place]) 02/23/2001   no hx of CIA aneurysm seen 09/17/16, but has PAD with iliac occlusive disease; Left  and Severe claudication Left leg  secondary to  iliac occlusive  disease 03/06/2004   Degenerative joint disease    Diabetes mellitus    Type II   Hypertension    PAD (peripheral artery disease) ([facility])    left CIA stents '02, '05, right CIA stent '03   Family History  Problem Relation Age of Onset   Diabetes Mother    Heart disease Mother    Diabetes Father    Hyperlipidemia Father    Hypertension Father    Heart disease Father    Past Surgical History:  Procedure Laterality Date   ANGIOPLASTY     common iliac artery L '02, '05; R '03   LOWER EXTREMITY ANGIOGRAPHY  03/25/2017   Procedure: Lower Extremity Angiography;  Surgeon: Waynetta Sandy, MD;  Location: [facility];  Service: Cardiovascular;;   PERIPHERAL VASCULAR INTERVENTION Right 03/25/2017   Procedure: Peripheral Vascular Intervention;  Surgeon: Waynetta Sandy, MD;  Location: [facility];  Service:  Cardiovascular;  Laterality: Right;  common iliac    Short Social History:  Social History   Tobacco Use   Smoking status: Former    Packs/day: 2.00    Years: 37.00    Total pack years: 74.00    Types: Cigarettes    Quit date: 03/12/2004    Years since quitting: 18.3   Smokeless tobacco: Never  Substance Use Topics   Alcohol use: No    Alcohol/week: 0.0 standard drinks of alcohol    No Known Allergies  Current Outpatient Medications  Medication Sig Dispense Refill   amLODipine (NORVASC) 5 MG tablet Take 5 mg by mouth daily.     ascorbic acid (VITAMIN C) 500 MG tablet Take 500 mg by mouth daily.     celecoxib (CELEBREX) 200 MG capsule Take 200 mg by mouth 2 (two) times daily as needed for mild pain.      clopidogrel (PLAVIX) 75 MG tablet TAKE 1 TABLET BY MOUTH EVERY DAY (Patient taking differently: Take 75 mg by mouth daily.) 90 tablet 3   clopidogrel (PLAVIX) 75 MG tablet TAKE 1 TABLET(75 MG) BY MOUTH DAILY 90 tablet 6   Coenzyme Q10 (COQ10) 400 MG CAPS Take 400 mg by mouth daily.     diclofenac Sodium (VOLTAREN) 1 % GEL Apply 1 application topically 4 (four) times daily as needed (pain).     HYDROcodone-acetaminophen (NORCO/VICODIN) 5-325  MG tablet Take 1 tablet by mouth every 6 (six) hours as needed for moderate pain.      insulin NPH-regular Human (70-30) 100 UNIT/ML injection Inject 20-70 Units into the skin See admin instructions. Inject 70 units in the morning and 20 units in the evening     JANUVIA 100 MG tablet Take 100 mg by mouth daily.      losartan-hydrochlorothiazide (HYZAAR) 100-12.5 MG tablet Take 1 tablet by mouth daily.      Naproxen Sod-diphenhydrAMINE (ALEVE PM) 220-25 MG TABS Take 2 tablets by mouth at bedtime as needed (sleep).     omeprazole (PRILOSEC) 20 MG capsule Take 20 mg by mouth daily.     RELION INSULIN SYRINGE 1ML/31G 31G X 5/16" 1 ML MISC      rosuvastatin (CRESTOR) 20 MG tablet Take 20 mg by mouth daily.   3   traMADol (ULTRAM) 50 MG tablet Take 1  tablet (50 mg total) by mouth every 6 (six) hours as needed. 40 tablet 0   No current facility-administered medications for this visit.    Review of Systems  Constitutional:  Constitutional negative. HENT: HENT negative.  Eyes: Eyes negative.  Respiratory: Respiratory negative.  Cardiovascular: Cardiovascular negative.  GI: Gastrointestinal negative.  Musculoskeletal: Positive for back pain.  Skin: Skin negative.  Neurological: Neurological negative. Hematologic: Hematologic/lymphatic negative.        Objective:  Objective   Vitals:   07/15/22 1515  BP: (!) 173/72  Pulse: (!) 56  Resp: 20  Temp: 97.8 F (36.6 C)  SpO2: 98%  Weight: 193 lb (87.5 kg)  Height: '5\' 11"'$  (1.803 m)   Body mass index is 26.92 kg/m.  Physical Exam HENT:     Head: Normocephalic.     Nose: Nose normal.  Eyes:     Pupils: Pupils are equal, round, and reactive to light.  Cardiovascular:     Pulses:          Femoral pulses are 2+ on the right side and 2+ on the left side.      Dorsalis pedis pulses are 1+ on the right side and 1+ on the left side.  Pulmonary:     Effort: Pulmonary effort is normal.  Abdominal:     General: Abdomen is flat.  Musculoskeletal:     Right lower leg: No edema.  Skin:    General: Skin is warm and dry.     Capillary Refill: Capillary refill takes less than 2 seconds.  Neurological:     General: No focal deficit present.     Mental Status: He is alert.     Data: ABI Findings:  +---------+------------------+-----+---------+--------+  Right    Rt Pressure (mmHg)IndexWaveform Comment   +---------+------------------+-----+---------+--------+  Brachial 136                                       +---------+------------------+-----+---------+--------+  PTA      163               1.17 triphasic          +---------+------------------+-----+---------+--------+  PERO     143               1.03 triphasic           +---------+------------------+-----+---------+--------+  DP       155               1.12  triphasic          +---------+------------------+-----+---------+--------+  Doristine Devoid Toe127               0.91 Normal             +---------+------------------+-----+---------+--------+   +---------+------------------+-----+---------+-------+  Left     Lt Pressure (mmHg)IndexWaveform Comment  +---------+------------------+-----+---------+-------+  Brachial 139                                      +---------+------------------+-----+---------+-------+  PTA      172               1.24 triphasic         +---------+------------------+-----+---------+-------+  PERO     151               1.09 triphasic         +---------+------------------+-----+---------+-------+  DP       170               1.22 triphasic         +---------+------------------+-----+---------+-------+  Rushie Chestnut               1.13 Normal            +---------+------------------+-----+---------+-------+   +-------+-----------+-----------+------------+------------+  ABI/TBIToday's ABIToday's TBIPrevious ABIPrevious TBI  +-------+-----------+-----------+------------+------------+  Right  1.17       .91        .95         .61           +-------+-----------+-----------+------------+------------+  Left   1.24       1.13       1.16        .81           +-------+-----------+-----------+------------+------------+          Bilateral ABIs and left TBIs appear essentially unchanged compared to  prior study on 06/10/2018. Right TBIs appear increased compared to prior  study on 06/10/2018.     Summary:  Right: Resting right ankle-brachial index is within normal range. The  right toe-brachial index is normal.   Left: Resting left ankle-brachial index is within normal range. The left  toe-brachial index is normal.   Abdominal Aorta Findings:   +-------------+-------+----------+----------+-----------+--------+--------+   Location     AP (cm)Trans (cm)PSV (cm/s)Waveform    ThrombusComments  +-------------+-------+----------+----------+-----------+--------+--------+   Proximal     2.10   2.20      121                                      +-------------+-------+----------+----------+-----------+--------+--------+   Mid                           178                                      +-------------+-------+----------+----------+-----------+--------+--------+   Distal                        194                                      +-------------+-------+----------+----------+-----------+--------+--------+  RT CIA Distal                 211       biphasic                       +-------------+-------+----------+----------+-----------+--------+--------+   RT EIA Prox                   228       biphasic                       +-------------+-------+----------+----------+-----------+--------+--------+   RT EIA Mid                    96        biphasic                       +-------------+-------+----------+----------+-----------+--------+--------+   RT EIA Distal                 112       biphasic                       +-------------+-------+----------+----------+-----------+--------+--------+   LT CIA Prox                   162       multiphasic                    +-------------+-------+----------+----------+-----------+--------+--------+   LT CIA Distal                 178       biphasic                       +-------------+-------+----------+----------+-----------+--------+--------+   LT EIA Prox                   187       biphasic                       +-------------+-------+----------+----------+-----------+--------+--------+   LT EIA Mid                    161       biphasic                        +-------------+-------+----------+----------+-----------+--------+--------+   LT EIA Distal                 115       biphasic                       +-------------+-------+----------+----------+-----------+--------+--------+      IVC/Iliac Findings:  +--------+------+--------+--------+    IVC   PatentThrombusComments  +--------+------+--------+--------+  IVC Proxpatent                  +--------+------+--------+--------+           Right Stent(s):  +-------------------+--------+---------------+--------+---------+  Common Iliac ArteryPSV cm/sStenosis       WaveformComments   +-------------------+--------+---------------+--------+---------+  Prox to Stent      474                    biphasicturbulent  +-------------------+--------+---------------+--------+---------+  Proximal Stent     534     50-99% stenosisbiphasicturbulent  +-------------------+--------+---------------+--------+---------+  Mid Stent  400                    biphasicturbulent  +-------------------+--------+---------------+--------+---------+  Distal Stent       246                    biphasicturbulent  +-------------------+--------+---------------+--------+---------+  Distal to Stent    211                    biphasic           +-------------------+--------+---------------+--------+---------+   The velocities throughout the right common iliac artery stent are elevated  and essentially stable compared to prior exam.         Left Stent(s):  +-------------------+--------+--------+-----------+--------+  Common Iliac ArteryPSV cm/sStenosisWaveform   Comments  +-------------------+--------+--------+-----------+--------+  Prox to Stent      208             multiphasic          +-------------------+--------+--------+-----------+--------+  Proximal Stent     258             biphasic              +-------------------+--------+--------+-----------+--------+  Mid Stent          215             biphasic             +-------------------+--------+--------+-----------+--------+  Distal Stent       223             biphasic             +-------------------+--------+--------+-----------+--------+  Distal to Stent    194             biphasic             +-------------------+--------+--------+-----------+--------+   The velocities throughout the left common iliac artery stent are  consistent with prior exam; no focal stenosis is evident.        Summary:  Abdominal Aorta: Aortoiliac atherosclerosis. The largest aortic  measurement is 2.2 cm.   Stenosis:  +-------------------+------------+--------------+--------------------------  ----+  Location           Stenosis    Stent         Comments                          +-------------------+------------+--------------+--------------------------  ----+  Right Common Iliac             50-99%        essentially stable                                               stenosis                                        +-------------------+------------+--------------+--------------------------  ----+  Left Common Iliac              no stenosis                                     +-------------------+------------+--------------+--------------------------  ----+  Right External     >50%  proximal segment                  Iliac              stenosis                                                    +-------------------+------------+--------------+--------------------------  ----+  Left External Iliac<50%                      proximal segment, high  end                         stenosis                  range                            +-------------------+------------+--------------+--------------------------  ----+      Assessment/Plan:    76 year old male with  a history of bilateral common iliac artery stenting for claudication now with stable stenosis of the right common iliac artery stent and no stenosis on the left and preserved ABIs and palpable pulses distally.  Plan will be to continue medical therapy and follow-up in 1 year with repeat studies.    Waynetta Sandy MD [facility]

## 2022-07-27 DIAGNOSIS — M5416 Radiculopathy, lumbar region: Secondary | ICD-10-CM | POA: Diagnosis not present

## 2022-08-11 DIAGNOSIS — M5416 Radiculopathy, lumbar region: Secondary | ICD-10-CM | POA: Diagnosis not present

## 2022-08-24 DIAGNOSIS — I10 Essential (primary) hypertension: Secondary | ICD-10-CM | POA: Diagnosis not present

## 2022-08-24 DIAGNOSIS — E785 Hyperlipidemia, unspecified: Secondary | ICD-10-CM | POA: Diagnosis not present

## 2022-08-24 DIAGNOSIS — M48062 Spinal stenosis, lumbar region with neurogenic claudication: Secondary | ICD-10-CM | POA: Diagnosis not present

## 2022-08-24 DIAGNOSIS — Z0000 Encounter for general adult medical examination without abnormal findings: Secondary | ICD-10-CM | POA: Diagnosis not present

## 2022-08-24 DIAGNOSIS — Z79899 Other long term (current) drug therapy: Secondary | ICD-10-CM | POA: Diagnosis not present

## 2022-08-24 DIAGNOSIS — Z125 Encounter for screening for malignant neoplasm of prostate: Secondary | ICD-10-CM | POA: Diagnosis not present

## 2022-08-24 DIAGNOSIS — E1165 Type 2 diabetes mellitus with hyperglycemia: Secondary | ICD-10-CM | POA: Diagnosis not present

## 2022-08-24 DIAGNOSIS — I739 Peripheral vascular disease, unspecified: Secondary | ICD-10-CM | POA: Diagnosis not present

## 2022-08-24 DIAGNOSIS — G894 Chronic pain syndrome: Secondary | ICD-10-CM | POA: Diagnosis not present

## 2022-08-24 DIAGNOSIS — E1121 Type 2 diabetes mellitus with diabetic nephropathy: Secondary | ICD-10-CM | POA: Diagnosis not present

## 2022-08-24 DIAGNOSIS — N182 Chronic kidney disease, stage 2 (mild): Secondary | ICD-10-CM | POA: Diagnosis not present

## 2022-08-24 DIAGNOSIS — F330 Major depressive disorder, recurrent, mild: Secondary | ICD-10-CM | POA: Diagnosis not present

## 2022-08-24 DIAGNOSIS — E038 Other specified hypothyroidism: Secondary | ICD-10-CM | POA: Diagnosis not present

## 2022-09-02 DIAGNOSIS — R059 Cough, unspecified: Secondary | ICD-10-CM | POA: Diagnosis not present

## 2022-09-02 DIAGNOSIS — R051 Acute cough: Secondary | ICD-10-CM | POA: Diagnosis not present

## 2022-09-02 DIAGNOSIS — H109 Unspecified conjunctivitis: Secondary | ICD-10-CM | POA: Diagnosis not present

## 2022-09-04 DIAGNOSIS — H6122 Impacted cerumen, left ear: Secondary | ICD-10-CM | POA: Diagnosis not present

## 2022-09-09 DIAGNOSIS — N182 Chronic kidney disease, stage 2 (mild): Secondary | ICD-10-CM | POA: Diagnosis not present

## 2022-09-09 DIAGNOSIS — E1122 Type 2 diabetes mellitus with diabetic chronic kidney disease: Secondary | ICD-10-CM | POA: Diagnosis not present

## 2022-09-09 DIAGNOSIS — I10 Essential (primary) hypertension: Secondary | ICD-10-CM | POA: Diagnosis not present

## 2022-09-09 DIAGNOSIS — E785 Hyperlipidemia, unspecified: Secondary | ICD-10-CM | POA: Diagnosis not present

## 2022-09-09 DIAGNOSIS — E1121 Type 2 diabetes mellitus with diabetic nephropathy: Secondary | ICD-10-CM | POA: Diagnosis not present

## 2022-12-01 DIAGNOSIS — Z1211 Encounter for screening for malignant neoplasm of colon: Secondary | ICD-10-CM | POA: Diagnosis not present

## 2022-12-01 DIAGNOSIS — D649 Anemia, unspecified: Secondary | ICD-10-CM | POA: Diagnosis not present

## 2022-12-01 DIAGNOSIS — N183 Chronic kidney disease, stage 3 unspecified: Secondary | ICD-10-CM | POA: Diagnosis not present

## 2022-12-25 DIAGNOSIS — E7800 Pure hypercholesterolemia, unspecified: Secondary | ICD-10-CM | POA: Diagnosis not present

## 2022-12-25 DIAGNOSIS — E1165 Type 2 diabetes mellitus with hyperglycemia: Secondary | ICD-10-CM | POA: Diagnosis not present

## 2022-12-25 DIAGNOSIS — I739 Peripheral vascular disease, unspecified: Secondary | ICD-10-CM | POA: Diagnosis not present

## 2022-12-25 DIAGNOSIS — G629 Polyneuropathy, unspecified: Secondary | ICD-10-CM | POA: Diagnosis not present

## 2022-12-25 DIAGNOSIS — E1121 Type 2 diabetes mellitus with diabetic nephropathy: Secondary | ICD-10-CM | POA: Diagnosis not present

## 2022-12-25 DIAGNOSIS — I10 Essential (primary) hypertension: Secondary | ICD-10-CM | POA: Diagnosis not present

## 2022-12-31 ENCOUNTER — Other Ambulatory Visit: Payer: Self-pay | Admitting: Vascular Surgery

## 2023-01-04 ENCOUNTER — Other Ambulatory Visit: Payer: Self-pay

## 2023-01-04 MED ORDER — CLOPIDOGREL BISULFATE 75 MG PO TABS: ORAL_TABLET | ORAL | 3 refills | Status: DC

## 2023-01-21 DIAGNOSIS — M19041 Primary osteoarthritis, right hand: Secondary | ICD-10-CM | POA: Diagnosis not present

## 2023-01-21 DIAGNOSIS — G5621 Lesion of ulnar nerve, right upper limb: Secondary | ICD-10-CM | POA: Diagnosis not present

## 2023-01-21 DIAGNOSIS — G5623 Lesion of ulnar nerve, bilateral upper limbs: Secondary | ICD-10-CM | POA: Diagnosis not present

## 2023-01-21 DIAGNOSIS — G5602 Carpal tunnel syndrome, left upper limb: Secondary | ICD-10-CM | POA: Diagnosis not present

## 2023-01-21 DIAGNOSIS — M19042 Primary osteoarthritis, left hand: Secondary | ICD-10-CM | POA: Diagnosis not present

## 2023-01-21 DIAGNOSIS — G5601 Carpal tunnel syndrome, right upper limb: Secondary | ICD-10-CM | POA: Diagnosis not present

## 2023-02-23 DIAGNOSIS — E162 Hypoglycemia, unspecified: Secondary | ICD-10-CM | POA: Diagnosis not present

## 2023-02-23 DIAGNOSIS — M48062 Spinal stenosis, lumbar region with neurogenic claudication: Secondary | ICD-10-CM | POA: Diagnosis not present

## 2023-02-23 DIAGNOSIS — E1122 Type 2 diabetes mellitus with diabetic chronic kidney disease: Secondary | ICD-10-CM | POA: Diagnosis not present

## 2023-02-23 DIAGNOSIS — F330 Major depressive disorder, recurrent, mild: Secondary | ICD-10-CM | POA: Diagnosis not present

## 2023-02-23 DIAGNOSIS — E1151 Type 2 diabetes mellitus with diabetic peripheral angiopathy without gangrene: Secondary | ICD-10-CM | POA: Diagnosis not present

## 2023-03-29 DIAGNOSIS — E7800 Pure hypercholesterolemia, unspecified: Secondary | ICD-10-CM | POA: Diagnosis not present

## 2023-03-29 DIAGNOSIS — E1165 Type 2 diabetes mellitus with hyperglycemia: Secondary | ICD-10-CM | POA: Diagnosis not present

## 2023-04-05 DIAGNOSIS — E7800 Pure hypercholesterolemia, unspecified: Secondary | ICD-10-CM | POA: Diagnosis not present

## 2023-04-05 DIAGNOSIS — E1121 Type 2 diabetes mellitus with diabetic nephropathy: Secondary | ICD-10-CM | POA: Diagnosis not present

## 2023-04-05 DIAGNOSIS — I739 Peripheral vascular disease, unspecified: Secondary | ICD-10-CM | POA: Diagnosis not present

## 2023-04-05 DIAGNOSIS — E1165 Type 2 diabetes mellitus with hyperglycemia: Secondary | ICD-10-CM | POA: Diagnosis not present

## 2023-04-05 DIAGNOSIS — I10 Essential (primary) hypertension: Secondary | ICD-10-CM | POA: Diagnosis not present

## 2023-04-05 DIAGNOSIS — G629 Polyneuropathy, unspecified: Secondary | ICD-10-CM | POA: Diagnosis not present

## 2023-04-21 DIAGNOSIS — M5459 Other low back pain: Secondary | ICD-10-CM | POA: Diagnosis not present

## 2023-07-12 DIAGNOSIS — M5416 Radiculopathy, lumbar region: Secondary | ICD-10-CM | POA: Diagnosis not present

## 2023-08-10 ENCOUNTER — Other Ambulatory Visit: Payer: Self-pay | Admitting: *Deleted

## 2023-08-10 DIAGNOSIS — I739 Peripheral vascular disease, unspecified: Secondary | ICD-10-CM

## 2023-08-13 DIAGNOSIS — E1165 Type 2 diabetes mellitus with hyperglycemia: Secondary | ICD-10-CM | POA: Diagnosis not present

## 2023-08-13 DIAGNOSIS — I739 Peripheral vascular disease, unspecified: Secondary | ICD-10-CM | POA: Diagnosis not present

## 2023-08-13 DIAGNOSIS — E7800 Pure hypercholesterolemia, unspecified: Secondary | ICD-10-CM | POA: Diagnosis not present

## 2023-08-13 DIAGNOSIS — I10 Essential (primary) hypertension: Secondary | ICD-10-CM | POA: Diagnosis not present

## 2023-08-13 DIAGNOSIS — E1121 Type 2 diabetes mellitus with diabetic nephropathy: Secondary | ICD-10-CM | POA: Diagnosis not present

## 2023-08-13 DIAGNOSIS — G629 Polyneuropathy, unspecified: Secondary | ICD-10-CM | POA: Diagnosis not present

## 2023-08-17 DIAGNOSIS — G894 Chronic pain syndrome: Secondary | ICD-10-CM | POA: Diagnosis not present

## 2023-08-17 DIAGNOSIS — M48062 Spinal stenosis, lumbar region with neurogenic claudication: Secondary | ICD-10-CM | POA: Diagnosis not present

## 2023-08-18 ENCOUNTER — Ambulatory Visit (HOSPITAL_COMMUNITY): Payer: Medicare HMO

## 2023-08-18 ENCOUNTER — Ambulatory Visit: Payer: Medicare HMO | Admitting: Vascular Surgery

## 2023-08-18 ENCOUNTER — Ambulatory Visit (HOSPITAL_COMMUNITY): Payer: Medicare HMO | Attending: Vascular Surgery

## 2023-09-16 DIAGNOSIS — Z0000 Encounter for general adult medical examination without abnormal findings: Secondary | ICD-10-CM | POA: Diagnosis not present

## 2023-09-16 DIAGNOSIS — E1122 Type 2 diabetes mellitus with diabetic chronic kidney disease: Secondary | ICD-10-CM | POA: Diagnosis not present

## 2023-09-16 DIAGNOSIS — Z79899 Other long term (current) drug therapy: Secondary | ICD-10-CM | POA: Diagnosis not present

## 2023-09-16 DIAGNOSIS — G5602 Carpal tunnel syndrome, left upper limb: Secondary | ICD-10-CM | POA: Diagnosis not present

## 2023-09-16 DIAGNOSIS — Z794 Long term (current) use of insulin: Secondary | ICD-10-CM | POA: Diagnosis not present

## 2023-09-16 DIAGNOSIS — M1991 Primary osteoarthritis, unspecified site: Secondary | ICD-10-CM | POA: Diagnosis not present

## 2023-09-16 DIAGNOSIS — Z1331 Encounter for screening for depression: Secondary | ICD-10-CM | POA: Diagnosis not present

## 2023-09-16 DIAGNOSIS — E663 Overweight: Secondary | ICD-10-CM | POA: Diagnosis not present

## 2023-09-16 DIAGNOSIS — M79641 Pain in right hand: Secondary | ICD-10-CM | POA: Diagnosis not present

## 2023-09-16 DIAGNOSIS — N183 Chronic kidney disease, stage 3 unspecified: Secondary | ICD-10-CM | POA: Diagnosis not present

## 2023-09-16 DIAGNOSIS — E038 Other specified hypothyroidism: Secondary | ICD-10-CM | POA: Diagnosis not present

## 2023-09-16 DIAGNOSIS — G5601 Carpal tunnel syndrome, right upper limb: Secondary | ICD-10-CM | POA: Diagnosis not present

## 2023-09-16 DIAGNOSIS — M79642 Pain in left hand: Secondary | ICD-10-CM | POA: Diagnosis not present

## 2023-09-16 DIAGNOSIS — M48061 Spinal stenosis, lumbar region without neurogenic claudication: Secondary | ICD-10-CM | POA: Diagnosis not present

## 2023-09-16 DIAGNOSIS — Z6828 Body mass index (BMI) 28.0-28.9, adult: Secondary | ICD-10-CM | POA: Diagnosis not present

## 2023-09-30 DIAGNOSIS — M1120 Other chondrocalcinosis, unspecified site: Secondary | ICD-10-CM | POA: Diagnosis not present

## 2023-09-30 DIAGNOSIS — Z6828 Body mass index (BMI) 28.0-28.9, adult: Secondary | ICD-10-CM | POA: Diagnosis not present

## 2023-09-30 DIAGNOSIS — G5602 Carpal tunnel syndrome, left upper limb: Secondary | ICD-10-CM | POA: Diagnosis not present

## 2023-09-30 DIAGNOSIS — M79642 Pain in left hand: Secondary | ICD-10-CM | POA: Diagnosis not present

## 2023-09-30 DIAGNOSIS — M1991 Primary osteoarthritis, unspecified site: Secondary | ICD-10-CM | POA: Diagnosis not present

## 2023-09-30 DIAGNOSIS — E663 Overweight: Secondary | ICD-10-CM | POA: Diagnosis not present

## 2023-09-30 DIAGNOSIS — M79641 Pain in right hand: Secondary | ICD-10-CM | POA: Diagnosis not present

## 2023-09-30 DIAGNOSIS — M48061 Spinal stenosis, lumbar region without neurogenic claudication: Secondary | ICD-10-CM | POA: Diagnosis not present

## 2023-09-30 DIAGNOSIS — G5601 Carpal tunnel syndrome, right upper limb: Secondary | ICD-10-CM | POA: Diagnosis not present

## 2023-10-13 ENCOUNTER — Encounter: Payer: Self-pay | Admitting: Vascular Surgery

## 2023-10-13 ENCOUNTER — Ambulatory Visit: Payer: Medicare HMO | Admitting: Vascular Surgery

## 2023-10-13 ENCOUNTER — Ambulatory Visit (INDEPENDENT_AMBULATORY_CARE_PROVIDER_SITE_OTHER)
Admission: RE | Admit: 2023-10-13 | Discharge: 2023-10-13 | Disposition: A | Discharge status: Home / Self Care | Payer: Medicare HMO | Source: Ambulatory Visit | Attending: Vascular Surgery | Admitting: Vascular Surgery

## 2023-10-13 ENCOUNTER — Ambulatory Visit (HOSPITAL_COMMUNITY)
Admission: RE | Admit: 2023-10-13 | Discharge: 2023-10-13 | Disposition: A | Discharge status: Home / Self Care | Payer: Medicare HMO | Source: Ambulatory Visit | Attending: Vascular Surgery | Admitting: Vascular Surgery

## 2023-10-13 VITALS — BP 156/70 | HR 69 | Temp 98.1°F | Resp 20 | Ht 71.0 in | Wt 200.5 lb

## 2023-10-13 DIAGNOSIS — I739 Peripheral vascular disease, unspecified: Secondary | ICD-10-CM

## 2023-10-13 LAB — VAS US ABI WITH/WO TBI
Left ABI: 0.83
Right ABI: 1.05

## 2023-10-13 NOTE — Progress Notes (Signed)
 Patient ID: Peter Walker, male   DOB: 1945-11-13, 78 y.o.   MRN: [id number]  Reason for Consult: Follow-up   Referred by Verena Mems, MD  Subjective:     HPI:  Peter Walker is a 78 y.o. male well-known to me with history of claudication status post bilateral common iliac artery stenting.  At last visit he had intentionally lost weight that he has kept this weight off currently weighing 200 pounds.  He does have neuropathy particularly of both hands and also has a trigger finger on the left ring finger.  He states that he also has neuropathy of his bilateral feet.  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  He denies any history of stroke, TIA or amaurosis and has no active claudication, rest pain or tissue loss.  Past Medical History:  Diagnosis Date   Aneurysm, common iliac artery (HCC) 02/23/2001   no hx of CIA aneurysm seen 09/17/16, but has PAD with iliac occlusive disease; Left  and Severe claudication Left leg  secondary to  iliac occlusive  disease 03/06/2004   Degenerative joint disease    Diabetes mellitus    Type II   Hypertension    PAD (peripheral artery disease) (HCC)    left CIA stents '02, '05, right CIA stent '03   Family History  Problem Relation Age of Onset   Diabetes Mother    Heart disease Mother    Diabetes Father    Hyperlipidemia Father    Hypertension Father    Heart disease Father    Past Surgical History:  Procedure Laterality Date   ANGIOPLASTY     common iliac artery L '02, '05; R '03   LOWER EXTREMITY ANGIOGRAPHY  03/25/2017   Procedure: Lower Extremity Angiography;  Surgeon: Sheree Penne Bruckner, MD;  Location: [facility] INVASIVE CV LAB;  Service: Cardiovascular;;   PERIPHERAL VASCULAR INTERVENTION Right 03/25/2017   Procedure: Peripheral Vascular Intervention;  Surgeon: Sheree Penne Bruckner, MD;  Location: [facility] INVASIVE CV LAB;  Service: Cardiovascular;   Laterality: Right;  common iliac    Short Social History:  Social History   Tobacco Use   Smoking status: Former    Current packs/day: 0.00    Average packs/day: 2.0 packs/day for 37.0 years (74.0 ttl pk-yrs)    Types: Cigarettes    Start date: 03/13/1967    Quit date: 03/12/2004    Years since quitting: 19.6   Smokeless tobacco: Never  Substance Use Topics   Alcohol use: No    Alcohol/week: 0.0 standard drinks of alcohol    No Known Allergies  Current Outpatient Medications  Medication Sig Dispense Refill   amLODipine (NORVASC) 5 MG tablet Take 5 mg by mouth daily.     ascorbic acid (VITAMIN C) 500 MG tablet Take 500 mg by mouth daily.     celecoxib (CELEBREX) 200 MG capsule Take 200 mg by mouth 2 (two) times daily as needed for mild pain.      clopidogrel  (PLAVIX ) 75 MG tablet TAKE 1 TABLET(75 MG) BY MOUTH DAILY 90 tablet 3   Coenzyme Q10 (COQ10) 400 MG CAPS Take 400 mg by mouth daily.     diclofenac Sodium (VOLTAREN) 1 % GEL Apply 1 application topically 4 (four) times daily as needed (pain).     HYDROcodone-acetaminophen  (NORCO/VICODIN) 5-325 MG tablet Take 1 tablet by mouth every 6 (six) hours as needed for moderate pain.  insulin NPH-regular Human (70-30) 100 UNIT/ML injection Inject 20-70 Units into the skin See admin instructions. Inject 70 units in the morning and 20 units in the evening     JANUVIA 100 MG tablet Take 100 mg by mouth daily.      losartan-hydrochlorothiazide (HYZAAR) 100-12.5 MG tablet Take 1 tablet by mouth daily.      Naproxen Sod-diphenhydrAMINE  (ALEVE PM) 220-25 MG TABS Take 2 tablets by mouth at bedtime as needed (sleep).     omeprazole (PRILOSEC) 20 MG capsule Take 20 mg by mouth daily.     RELION INSULIN SYRINGE 1ML/31G 31G X 5/16 1 ML MISC      rosuvastatin (CRESTOR) 20 MG tablet Take 20 mg by mouth daily.   3   traMADol  (ULTRAM ) 50 MG tablet Take 1 tablet (50 mg total) by mouth every 6 (six) hours as needed. 40 tablet 0   No current  facility-administered medications for this visit.    Review of Systems  Constitutional:  Constitutional negative. HENT: HENT negative.  Eyes: Eyes negative.  Respiratory: Respiratory negative.  GI: Gastrointestinal negative.  Musculoskeletal:       Trigger finger Skin: Skin negative.  Neurological: Positive for numbness.  Hematologic: Hematologic/lymphatic negative.  Psychiatric: Psychiatric negative.        Objective:  Objective   Vitals:   10/13/23 0847  BP: (!) 156/70  Pulse: 69  Resp: 20  Temp: 98.1 F (36.7 C)  SpO2: 98%  Weight: 200 lb 8 oz (90.9 kg)  Height: 5' 11 (1.803 m)   Body mass index is 27.96 kg/m.  Physical Exam HENT:     Head: Normocephalic.     Nose: Nose normal.  Eyes:     Pupils: Pupils are equal, round, and reactive to light.  Cardiovascular:     Rate and Rhythm: Normal rate.     Pulses:          Femoral pulses are 2+ on the right side and 2+ on the left side.      Popliteal pulses are 2+ on the right side and 2+ on the left side.  Pulmonary:     Effort: Pulmonary effort is normal.  Abdominal:     General: Abdomen is flat.     Palpations: Abdomen is soft.  Skin:    General: Skin is warm.     Capillary Refill: Capillary refill takes less than 2 seconds.  Neurological:     General: No focal deficit present.     Mental Status: He is alert.  Psychiatric:        Mood and Affect: Mood normal.        Thought Content: Thought content normal.        Judgment: Judgment normal.     Data: ABI Findings:  +---------+------------------+-----+---------+--------+  Right   Rt Pressure (mmHg)IndexWaveform Comment   +---------+------------------+-----+---------+--------+  Brachial 150                                       +---------+------------------+-----+---------+--------+  PTA     157               1.05 triphasic          +---------+------------------+-----+---------+--------+  DP      148               0.99  biphasic           +---------+------------------+-----+---------+--------+  Great Toe88                0.59 Abnormal           +---------+------------------+-----+---------+--------+   +---------+------------------+-----+----------+-------+  Left    Lt Pressure (mmHg)IndexWaveform  Comment  +---------+------------------+-----+----------+-------+  Brachial 148                                       +---------+------------------+-----+----------+-------+  PTA     118               0.79 biphasic           +---------+------------------+-----+----------+-------+  DP      124               0.83 monophasic         +---------+------------------+-----+----------+-------+  Great Toe85                0.57 Abnormal           +---------+------------------+-----+----------+-------+   +-------+-----------+-----------+------------+------------+  ABI/TBIToday's ABIToday's TBIPrevious ABIPrevious TBI  +-------+-----------+-----------+------------+------------+  Right 1.05       0.59       1.17        0.91          +-------+-----------+-----------+------------+------------+  Left  0.83       0.57       1.24        1.13          +-------+-----------+-----------+------------+------------+         Bilateral ABIs and TBIs appear essentially unchanged.    Summary:  Right: Resting right ankle-brachial index is within normal range. The  right toe-brachial index is abnormal.   Left: Resting left ankle-brachial index indicates mild left lower  extremity arterial disease. The left toe-brachial index is abnormal.   Abdominal Aorta Findings:  +-------------+-------+----------+----------+--------+--------+--------+  Location    AP (cm)Trans (cm)PSV (cm/s)WaveformThrombusComments  +-------------+-------+----------+----------+--------+--------+--------+  Distal                       147       biphasic                   +-------------+-------+----------+----------+--------+--------+--------+  RT CIA Prox                                             stent     +-------------+-------+----------+----------+--------+--------+--------+  RT CIA Mid                                              stent     +-------------+-------+----------+----------+--------+--------+--------+  RT CIA Distal                 171       biphasic                  +-------------+-------+----------+----------+--------+--------+--------+  RT EIA Prox                   152       biphasic                  +-------------+-------+----------+----------+--------+--------+--------+  RT EIA Mid  130       biphasic                  +-------------+-------+----------+----------+--------+--------+--------+  RT EIA Distal                 120       biphasic                  +-------------+-------+----------+----------+--------+--------+--------+  LT CIA Distal                 138       biphasic                  +-------------+-------+----------+----------+--------+--------+--------+  LT EIA Prox                   121       biphasic                  +-------------+-------+----------+----------+--------+--------+--------+  LT EIA Mid                    126       biphasic                  +-------------+-------+----------+----------+--------+--------+--------+  LT EIA Distal                 128       biphasic                  +-------------+-------+----------+----------+--------+--------+--------+     Right Stent(s):  +---------------+--------+---------------+--------+--------+  CIA           PSV cm/sStenosis       WaveformComments  +---------------+--------+---------------+--------+--------+  Prox to Stent  406     50-99% stenosisbiphasic          +---------------+--------+---------------+--------+--------+  Proximal Stent 432     50-99%  stenosisbiphasic          +---------------+--------+---------------+--------+--------+  Mid Stent      442     50-99% stenosisbiphasic          +---------------+--------+---------------+--------+--------+  Distal Stent   269     50-99% stenosisbiphasic          +---------------+--------+---------------+--------+--------+  Distal to Stent121                    biphasic          +---------------+--------+---------------+--------+--------+           Left Stent(s):  +---------------+--------+--------+--------+--------+  CIA           PSV cm/sStenosisWaveformComments  +---------------+--------+--------+--------+--------+  Prox to Stent  120             biphasic          +---------------+--------+--------+--------+--------+  Proximal Stent 190             biphasic          +---------------+--------+--------+--------+--------+  Mid Stent      187             biphasic          +---------------+--------+--------+--------+--------+  Distal Stent   126             biphasic          +---------------+--------+--------+--------+--------+  Distal to [id number]             biphasic          +---------------+--------+--------+--------+--------+  Summary:  50 - 99% stenosis in the right common iliac artery stent.   No focal stenosis visualized in the left common iliac artery stent.      Assessment/Plan:    78 year old male with history of bilateral common iliac artery stenting with known stenosis in the right common iliac artery stent but with palpable pulses distal to this.  He does have brisk capillary refill at his feet and his symptoms do not appear arterial insufficiency and as such I recommended continued activity lifestyle, Plavix  and statin.  He demonstrates good understanding and we will see him back in 1 year with repeat studies.     Penne Lonni Colorado MD [facility]

## 2023-10-26 ENCOUNTER — Other Ambulatory Visit: Payer: Self-pay

## 2023-10-26 DIAGNOSIS — I739 Peripheral vascular disease, unspecified: Secondary | ICD-10-CM

## 2023-11-05 ENCOUNTER — Encounter (HOSPITAL_BASED_OUTPATIENT_CLINIC_OR_DEPARTMENT_OTHER): Payer: Self-pay

## 2023-11-05 ENCOUNTER — Other Ambulatory Visit: Payer: Self-pay

## 2023-11-05 ENCOUNTER — Emergency Department (HOSPITAL_BASED_OUTPATIENT_CLINIC_OR_DEPARTMENT_OTHER)
Admission: EM | Admit: 2023-11-05 | Discharge: 2023-11-05 | Disposition: A | Discharge status: Home / Self Care | Payer: Medicare HMO | Attending: Emergency Medicine | Admitting: Emergency Medicine

## 2023-11-05 DIAGNOSIS — E162 Hypoglycemia, unspecified: Secondary | ICD-10-CM | POA: Diagnosis not present

## 2023-11-05 DIAGNOSIS — E11649 Type 2 diabetes mellitus with hypoglycemia without coma: Secondary | ICD-10-CM | POA: Diagnosis not present

## 2023-11-05 DIAGNOSIS — Z7902 Long term (current) use of antithrombotics/antiplatelets: Secondary | ICD-10-CM | POA: Insufficient documentation

## 2023-11-05 DIAGNOSIS — I10 Essential (primary) hypertension: Secondary | ICD-10-CM | POA: Diagnosis not present

## 2023-11-05 DIAGNOSIS — Z794 Long term (current) use of insulin: Secondary | ICD-10-CM | POA: Diagnosis not present

## 2023-11-05 DIAGNOSIS — Z79899 Other long term (current) drug therapy: Secondary | ICD-10-CM | POA: Insufficient documentation

## 2023-11-05 DIAGNOSIS — R531 Weakness: Secondary | ICD-10-CM | POA: Diagnosis not present

## 2023-11-05 LAB — BASIC METABOLIC PANEL
Anion gap: 8 (ref 5–15)
BUN: 21 mg/dL (ref 8–23)
CO2: 27 mmol/L (ref 22–32)
Calcium: 9 mg/dL (ref 8.9–10.3)
Chloride: 104 mmol/L (ref 98–111)
Creatinine, Ser: 1.25 mg/dL — ABNORMAL HIGH (ref 0.61–1.24)
GFR, Estimated: 59 mL/min — ABNORMAL LOW (ref >60)
Glucose, Bld: 78 mg/dL (ref 70–99)
Potassium: 3.8 mmol/L (ref 3.5–5.1)
Sodium: 139 mmol/L (ref 135–145)

## 2023-11-05 LAB — CBG MONITORING, ED: Glucose-Capillary: 97 mg/dL (ref 70–99)

## 2023-11-05 NOTE — ED Triage Notes (Addendum)
Pt POV from home with Wife d/t fluctuation in CBG.  Pt took "long acting insulin @ 2000 d/t CBG 191 mg/dL.  The CBG  at 2230 it was 55 mg/dL.  He drank OJ and it was 110 mg/dL.  Later it was lower and they were concerned.  Pt states he had a little blurred vision when down to 55 mg/dL.   Pt has his log with him on his meter and there were a few low readings but they have come up.

## 2023-11-05 NOTE — Discharge Instructions (Signed)
Be sure to call your diabetic doctor today for further instructions.  You can skip your morning dose of insulin

## 2023-11-05 NOTE — ED Provider Notes (Signed)
[place] EMERGENCY DEPARTMENT AT [facility] Provider Note   CSN: [id number] Arrival date & time: 11/05/23  0015     History  Chief Complaint  Patient presents with   Hypoglycemia    Peter Walker is a 78 y.o. male.  The history is provided by the patient and the spouse.  Patient with history of hypertension, peripheral artery disease, diabetes presents for concern for low blood glucose.  Patient reports he takes Januvia every day and NovoLog 70/30 3 times a day. Patient reports he took his NovoLog around 8 PM.  He had had a normal dinner and was otherwise at his baseline.  Later on the evening his glucose was checked and was around 55.  He drank orange juice and improved.  He reports he felt generalized weakness and blurred vision when his glucose was low but he is now feeling improved.  No vomiting or diarrhea.  No chest pain or abdominal pain   Past Medical History:  Diagnosis Date   Aneurysm, common iliac artery (HCC) 02/23/2001   no hx of CIA aneurysm seen 09/17/16, but has PAD with iliac occlusive disease; Left  and Severe claudication Left leg  secondary to  iliac occlusive  disease 03/06/2004   Degenerative joint disease    Diabetes mellitus    Type II   Hypertension    PAD (peripheral artery disease) (HCC)    left CIA stents '02, '05, right CIA stent '03    Home Medications Prior to Admission medications   Medication Sig Start Date End Date Taking? Authorizing Provider  amLODipine (NORVASC) 5 MG tablet Take 5 mg by mouth daily.    [provider]  ascorbic acid (VITAMIN C) 500 MG tablet Take 500 mg by mouth daily.    [provider]  celecoxib (CELEBREX) 200 MG capsule Take 200 mg by mouth 2 (two) times daily as needed for mild pain.  09/02/16   [provider]  clopidogrel (PLAVIX) 75 MG tablet TAKE 1 TABLET(75 MG) BY MOUTH DAILY 01/04/23   Maeola Harman, MD  Coenzyme Q10 (COQ10) 400 MG CAPS Take 400 mg by mouth daily.     [provider]  diclofenac Sodium (VOLTAREN) 1 % GEL Apply 1 application topically 4 (four) times daily as needed (pain).    [provider]  insulin NPH-regular Human (70-30) 100 UNIT/ML injection Inject 20-70 Units into the skin See admin instructions. Inject 70 units in the morning and 20 units in the evening    [provider]  JANUVIA 100 MG tablet Take 100 mg by mouth daily.  08/30/16   [provider]  losartan-hydrochlorothiazide (HYZAAR) 100-12.5 MG tablet Take 1 tablet by mouth daily.  08/30/16   [provider]  Naproxen Sod-diphenhydrAMINE (ALEVE PM) 220-25 MG TABS Take 2 tablets by mouth at bedtime as needed (sleep).    [provider]  omeprazole (PRILOSEC) 20 MG capsule Take 20 mg by mouth daily.    [provider]  RELION INSULIN SYRINGE 1ML/31G 31G X 5/16" 1 ML MISC  05/01/17   [provider]  rosuvastatin (CRESTOR) 20 MG tablet Take 20 mg by mouth daily.  11/14/17   [provider]      Allergies    Patient has no known allergies.    Review of Systems   Review of Systems  Constitutional:  Negative for fever.  Cardiovascular:  Negative for chest pain.  Gastrointestinal:  Negative for abdominal pain and vomiting.  Physical Exam Updated Vital Signs BP (!) 153/58   Pulse 60   Temp 98.3 F (36.8 C) (Oral)   Resp 19   Ht 1.803 m (5\' 11" )   Wt 91.2 kg   SpO2 90%   BMI 28.03 kg/m  Physical Exam CONSTITUTIONAL: Elderly, no acute distress HEAD: Normocephalic/atraumatic ENMT: Mucous membranes moist NECK: supple no meningeal signs CV: S1/S2 noted, no murmurs/rubs/gallops noted LUNGS: Lungs are clear to auscultation bilaterally, no apparent distress ABDOMEN: soft NEURO: Pt is awake/alert/appropriate, moves all extremitiesx4.  No facial droop.   SKIN: warm, color normal PSYCH: no abnormalities of mood noted, alert and oriented to situation  ED Results / Procedures / Treatments    Labs (all labs ordered are listed, but only abnormal results are displayed) Labs Reviewed  BASIC METABOLIC PANEL - Abnormal; Notable for the following components:      Result Value   Creatinine, Ser 1.25 (*)    GFR, Estimated 59 (*)    All other components within normal limits  CBG MONITORING, ED    EKG None  Radiology No results found.  Procedures Procedures    Medications Ordered in ED Medications - No data to display  ED Course/ Medical Decision Making/ A&P Clinical Course as of 11/05/23 0226  Fri Nov 05, 2023  0134 Patient is well appearing.  Has been taking his diabetic medicines as prescribed without any recent changes.  Spot check glucose when I was in the room was in the 80s.  He would drink orange juice, we will check electrolytes and monitor [DW]  0226 Glucoses trending in the right direction, now above 120.  He is in no acute distress and requesting discharge home. I suspect he may need to have his insulin dosing altered.  He will call his endocrinologist first thing in the morning.  He will skip his morning dose of insulin [DW]    Clinical Course User Index [DW] Zadie Rhine, MD                                 Medical Decision Making Amount and/or Complexity of Data Reviewed Labs: ordered.           Final Clinical Impression(s) / ED Diagnoses Final diagnoses:  Hypoglycemia    Rx / DC Orders ED Discharge Orders     None         Zadie Rhine, MD 11/05/23 [phone]

## 2023-12-10 ENCOUNTER — Other Ambulatory Visit: Payer: Self-pay | Admitting: Vascular Surgery

## 2024-01-06 ENCOUNTER — Other Ambulatory Visit (HOSPITAL_COMMUNITY): Payer: Self-pay | Admitting: Family Medicine

## 2024-01-06 DIAGNOSIS — R7989 Other specified abnormal findings of blood chemistry: Secondary | ICD-10-CM

## 2024-01-06 DIAGNOSIS — R112 Nausea with vomiting, unspecified: Secondary | ICD-10-CM

## 2024-01-06 DIAGNOSIS — K529 Noninfective gastroenteritis and colitis, unspecified: Secondary | ICD-10-CM | POA: Diagnosis not present

## 2024-01-07 ENCOUNTER — Ambulatory Visit (HOSPITAL_COMMUNITY)
Admission: RE | Admit: 2024-01-07 | Discharge: 2024-01-07 | Disposition: A | Discharge status: Home / Self Care | Source: Ambulatory Visit | Attending: Family Medicine | Admitting: Family Medicine

## 2024-01-07 DIAGNOSIS — R112 Nausea with vomiting, unspecified: Secondary | ICD-10-CM | POA: Insufficient documentation

## 2024-01-07 DIAGNOSIS — R7989 Other specified abnormal findings of blood chemistry: Secondary | ICD-10-CM | POA: Insufficient documentation

## 2024-01-07 DIAGNOSIS — R197 Diarrhea, unspecified: Secondary | ICD-10-CM | POA: Diagnosis not present

## 2024-01-07 DIAGNOSIS — N1831 Chronic kidney disease, stage 3a: Secondary | ICD-10-CM | POA: Diagnosis not present

## 2024-01-07 DIAGNOSIS — R1110 Vomiting, unspecified: Secondary | ICD-10-CM | POA: Diagnosis not present

## 2024-01-07 DIAGNOSIS — K760 Fatty (change of) liver, not elsewhere classified: Secondary | ICD-10-CM | POA: Diagnosis not present

## 2024-01-07 DIAGNOSIS — R748 Abnormal levels of other serum enzymes: Secondary | ICD-10-CM | POA: Diagnosis not present

## 2024-01-25 DIAGNOSIS — R748 Abnormal levels of other serum enzymes: Secondary | ICD-10-CM | POA: Diagnosis not present

## 2024-01-25 DIAGNOSIS — N1831 Chronic kidney disease, stage 3a: Secondary | ICD-10-CM | POA: Diagnosis not present

## 2024-02-07 ENCOUNTER — Other Ambulatory Visit: Payer: Self-pay | Admitting: Vascular Surgery

## 2024-02-10 DIAGNOSIS — E1165 Type 2 diabetes mellitus with hyperglycemia: Secondary | ICD-10-CM | POA: Diagnosis not present

## 2024-02-10 DIAGNOSIS — E7800 Pure hypercholesterolemia, unspecified: Secondary | ICD-10-CM | POA: Diagnosis not present

## 2024-02-17 DIAGNOSIS — I739 Peripheral vascular disease, unspecified: Secondary | ICD-10-CM | POA: Diagnosis not present

## 2024-02-17 DIAGNOSIS — E7800 Pure hypercholesterolemia, unspecified: Secondary | ICD-10-CM | POA: Diagnosis not present

## 2024-02-17 DIAGNOSIS — I10 Essential (primary) hypertension: Secondary | ICD-10-CM | POA: Diagnosis not present

## 2024-02-17 DIAGNOSIS — E1121 Type 2 diabetes mellitus with diabetic nephropathy: Secondary | ICD-10-CM | POA: Diagnosis not present

## 2024-02-17 DIAGNOSIS — G629 Polyneuropathy, unspecified: Secondary | ICD-10-CM | POA: Diagnosis not present

## 2024-02-17 DIAGNOSIS — E1165 Type 2 diabetes mellitus with hyperglycemia: Secondary | ICD-10-CM | POA: Diagnosis not present

## 2024-03-15 DIAGNOSIS — G629 Polyneuropathy, unspecified: Secondary | ICD-10-CM | POA: Diagnosis not present

## 2024-03-15 DIAGNOSIS — E7800 Pure hypercholesterolemia, unspecified: Secondary | ICD-10-CM | POA: Diagnosis not present

## 2024-03-15 DIAGNOSIS — E1165 Type 2 diabetes mellitus with hyperglycemia: Secondary | ICD-10-CM | POA: Diagnosis not present

## 2024-03-15 DIAGNOSIS — I10 Essential (primary) hypertension: Secondary | ICD-10-CM | POA: Diagnosis not present

## 2024-03-15 DIAGNOSIS — E1121 Type 2 diabetes mellitus with diabetic nephropathy: Secondary | ICD-10-CM | POA: Diagnosis not present

## 2024-03-15 DIAGNOSIS — I739 Peripheral vascular disease, unspecified: Secondary | ICD-10-CM | POA: Diagnosis not present

## 2024-03-20 DIAGNOSIS — M48062 Spinal stenosis, lumbar region with neurogenic claudication: Secondary | ICD-10-CM | POA: Diagnosis not present

## 2024-03-20 DIAGNOSIS — E1140 Type 2 diabetes mellitus with diabetic neuropathy, unspecified: Secondary | ICD-10-CM | POA: Diagnosis not present

## 2024-03-20 DIAGNOSIS — E1165 Type 2 diabetes mellitus with hyperglycemia: Secondary | ICD-10-CM | POA: Diagnosis not present

## 2024-03-23 DIAGNOSIS — M25551 Pain in right hip: Secondary | ICD-10-CM | POA: Diagnosis not present

## 2024-03-23 DIAGNOSIS — E119 Type 2 diabetes mellitus without complications: Secondary | ICD-10-CM | POA: Diagnosis not present

## 2024-04-05 DIAGNOSIS — M5441 Lumbago with sciatica, right side: Secondary | ICD-10-CM | POA: Diagnosis not present

## 2024-04-05 DIAGNOSIS — G8929 Other chronic pain: Secondary | ICD-10-CM | POA: Diagnosis not present

## 2024-04-11 ENCOUNTER — Other Ambulatory Visit: Payer: Self-pay

## 2024-04-11 DIAGNOSIS — Z0000 Encounter for general adult medical examination without abnormal findings: Secondary | ICD-10-CM | POA: Diagnosis not present

## 2024-04-11 DIAGNOSIS — I10 Essential (primary) hypertension: Secondary | ICD-10-CM | POA: Diagnosis not present

## 2024-04-11 DIAGNOSIS — Z7901 Long term (current) use of anticoagulants: Secondary | ICD-10-CM | POA: Insufficient documentation

## 2024-04-11 DIAGNOSIS — Z79899 Other long term (current) drug therapy: Secondary | ICD-10-CM | POA: Diagnosis not present

## 2024-04-11 DIAGNOSIS — E11649 Type 2 diabetes mellitus with hypoglycemia without coma: Secondary | ICD-10-CM | POA: Insufficient documentation

## 2024-04-11 DIAGNOSIS — E162 Hypoglycemia, unspecified: Secondary | ICD-10-CM | POA: Diagnosis present

## 2024-04-11 DIAGNOSIS — Z794 Long term (current) use of insulin: Secondary | ICD-10-CM | POA: Diagnosis not present

## 2024-04-12 ENCOUNTER — Encounter (HOSPITAL_BASED_OUTPATIENT_CLINIC_OR_DEPARTMENT_OTHER): Payer: Self-pay

## 2024-04-12 ENCOUNTER — Emergency Department (HOSPITAL_BASED_OUTPATIENT_CLINIC_OR_DEPARTMENT_OTHER)
Admission: EM | Admit: 2024-04-12 | Discharge: 2024-04-12 | Disposition: A | Discharge status: Home / Self Care | Attending: Emergency Medicine | Admitting: Emergency Medicine

## 2024-04-12 DIAGNOSIS — Z0000 Encounter for general adult medical examination without abnormal findings: Secondary | ICD-10-CM

## 2024-04-12 LAB — CBG MONITORING, ED
Glucose-Capillary: 138 mg/dL — ABNORMAL HIGH (ref 70–99)
Glucose-Capillary: 176 mg/dL — ABNORMAL HIGH (ref 70–99)

## 2024-04-12 NOTE — ED Provider Notes (Signed)
 [place] EMERGENCY DEPARTMENT AT [facility] Provider Note   CSN: [id number] Arrival date & time: 04/11/24  2355     Patient presents with: Hypoglycemia   Peter Walker is a 78 y.o. male.   The history is provided by the patient and the spouse.  Hypoglycemia Initial blood sugar:  53 Blood sugar after intervention:  138 then 176 Severity:  Mild Onset quality:  Sudden Timing:  Constant Progression:  Resolved Chronicity:  New Diabetic status:  Controlled with insulin (with pump and did not have any carbs in his meal this evening with continuous insulin) Context comment:  No carbohydrates this evening Relieved by: juice. Associated symptoms: no altered mental status, no decreased responsiveness, no tremors, no vomiting and no weakness   Diabetes with new insulin pump and did not have any carbs this evening with meal.  Only steak and zucchini and greens.      Past Medical History:  Diagnosis Date   Aneurysm, common iliac artery (HCC) 02/23/2001   no hx of CIA aneurysm seen 09/17/16, but has PAD with iliac occlusive disease; Left  and Severe claudication Left leg  secondary to  iliac occlusive  disease 03/06/2004   Degenerative joint disease    Diabetes mellitus    Type II   Hypertension    PAD (peripheral artery disease) (HCC)    left CIA stents '02, '05, right CIA stent '03     Prior to Admission medications   Medication Sig Start Date End Date Taking? Authorizing Provider  amLODipine (NORVASC) 5 MG tablet Take 5 mg by mouth daily.    [provider]  ascorbic acid (VITAMIN C) 500 MG tablet Take 500 mg by mouth daily.    [provider]  celecoxib (CELEBREX) 200 MG capsule Take 200 mg by mouth 2 (two) times daily as needed for mild pain.  09/02/16   [provider]  clopidogrel  (PLAVIX ) 75 MG tablet TAKE 1 TABLET(75 MG) BY MOUTH DAILY 02/08/24   Sheree Penne Bruckner, MD  Coenzyme Q10 (COQ10) 400 MG CAPS Take 400 mg by mouth daily.     [provider]  diclofenac Sodium (VOLTAREN) 1 % GEL Apply 1 application topically 4 (four) times daily as needed (pain).    [provider]  insulin NPH-regular Human (70-30) 100 UNIT/ML injection Inject 20-70 Units into the skin See admin instructions. Inject 70 units in the morning and 20 units in the evening    [provider]  JANUVIA 100 MG tablet Take 100 mg by mouth daily.  08/30/16   [provider]  losartan-hydrochlorothiazide (HYZAAR) 100-12.5 MG tablet Take 1 tablet by mouth daily.  08/30/16   [provider]  Naproxen Sod-diphenhydrAMINE  (ALEVE PM) 220-25 MG TABS Take 2 tablets by mouth at bedtime as needed (sleep).    [provider]  omeprazole (PRILOSEC) 20 MG capsule Take 20 mg by mouth daily.    [provider]  RELION INSULIN SYRINGE 1ML/31G 31G X 5/16 1 ML MISC  05/01/17   [provider]  rosuvastatin (CRESTOR) 20 MG tablet Take 20 mg by mouth daily.  11/14/17   [provider]    Allergies: Empagliflozin and Metformin hcl    Review of Systems  Constitutional:  Negative for decreased responsiveness and fever.  HENT:  Negative for facial swelling.   Respiratory:  Negative for wheezing and stridor.   Gastrointestinal:  Negative for vomiting.  Endocrine: Negative for polydipsia and polyphagia.  Neurological:  Negative for tremors  and weakness.  All other systems reviewed and are negative.   Updated Vital Signs BP (!) 149/66   Pulse 62   Temp 97.9 F (36.6 C) (Oral)   Resp 18   Ht 5' 11 (1.803 m)   Wt 90.8 kg   SpO2 97%   BMI 27.92 kg/m   Physical Exam Vitals and nursing note reviewed.  Constitutional:      General: He is not in acute distress.    Appearance: He is well-developed. He is not diaphoretic.  HENT:     Head: Normocephalic and atraumatic.     Nose: Nose normal.  Eyes:     Conjunctiva/sclera: Conjunctivae normal.     Pupils: Pupils are equal, round, and reactive  to light.  Cardiovascular:     Rate and Rhythm: Normal rate and regular rhythm.     Pulses: Normal pulses.     Heart sounds: Normal heart sounds.  Pulmonary:     Effort: Pulmonary effort is normal.     Breath sounds: Normal breath sounds. No wheezing or rales.  Abdominal:     General: Bowel sounds are normal.     Palpations: Abdomen is soft.     Tenderness: There is no abdominal tenderness. There is no guarding or rebound.  Musculoskeletal:        General: Normal range of motion.     Cervical back: Normal range of motion and neck supple.  Skin:    General: Skin is warm and dry.     Capillary Refill: Capillary refill takes less than 2 seconds.  Neurological:     General: No focal deficit present.     Mental Status: He is alert and oriented to person, place, and time.     Deep Tendon Reflexes: Reflexes normal.  Psychiatric:        Mood and Affect: Mood normal.        Behavior: Behavior normal.     (all labs ordered are listed, but only abnormal results are displayed) Labs Reviewed  CBG MONITORING, ED - Abnormal; Notable for the following components:      Result Value   Glucose-Capillary 138 (*)    All other components within normal limits  CBG MONITORING, ED - Abnormal; Notable for the following components:   Glucose-Capillary 176 (*)    All other components within normal limits    EKG: None  Radiology: No results found.   Procedures   Medications Ordered in the ED - No data to display                                  Medical Decision Making Patient with decreased carbohydrate intake with new insulin pump.  Sugar read 53, patient asymptomatic and had juice and came in   Amount and/or Complexity of Data Reviewed Independent Historian: spouse    Details: See above  External Data Reviewed: notes.    Details: Previous notes reviewed  Labs: ordered.    Details: Glucose 138 and then 176   Risk Risk Details: Patient is AO4 and asymptomatic.  I have advised  eating meals with some carbohydrates to prevent glucose from dropping too low on insulin pump.  I have also advised calling PMD to speak with them about diabetic nutritionist. Stable for discharge with close follow up.       Final diagnoses:  Normal exam   No signs of systemic illness or infection. The patient is  nontoxic-appearing on exam and vital signs are within normal limits.  I have reviewed the triage vital signs and the nursing notes. Pertinent labs & imaging results that were available during my care of the patient were reviewed by me and considered in my medical decision making (see chart for details). After history, exam, and medical workup I feel the patient has been appropriately medically screened and is safe for discharge home. Pertinent diagnoses were discussed with the patient. Patient was given return precautions.    ED Discharge Orders     None          Cristine Daw, MD 04/12/24 [id number]

## 2024-04-12 NOTE — ED Notes (Signed)
 CBG 138

## 2024-04-12 NOTE — ED Triage Notes (Signed)
 Pt POV with wife d/t getting a Low blood sugar reading of 53 mg/dL on the Omnipod .  He is new to the system and when the received the reading and beeping it said take action immediately.  He did drink OJ 8 oz and now 138 mg/dL.

## 2024-04-13 DIAGNOSIS — E7800 Pure hypercholesterolemia, unspecified: Secondary | ICD-10-CM | POA: Diagnosis not present

## 2024-04-13 DIAGNOSIS — E1165 Type 2 diabetes mellitus with hyperglycemia: Secondary | ICD-10-CM | POA: Diagnosis not present

## 2024-04-14 DIAGNOSIS — M5441 Lumbago with sciatica, right side: Secondary | ICD-10-CM | POA: Diagnosis not present

## 2024-04-14 DIAGNOSIS — G8929 Other chronic pain: Secondary | ICD-10-CM | POA: Diagnosis not present

## 2024-04-21 DIAGNOSIS — M5441 Lumbago with sciatica, right side: Secondary | ICD-10-CM | POA: Diagnosis not present

## 2024-04-21 DIAGNOSIS — G8929 Other chronic pain: Secondary | ICD-10-CM | POA: Diagnosis not present

## 2024-05-31 DIAGNOSIS — G8929 Other chronic pain: Secondary | ICD-10-CM | POA: Diagnosis not present

## 2024-05-31 DIAGNOSIS — M545 Low back pain, unspecified: Secondary | ICD-10-CM | POA: Diagnosis not present

## 2024-05-31 DIAGNOSIS — E11610 Type 2 diabetes mellitus with diabetic neuropathic arthropathy: Secondary | ICD-10-CM | POA: Diagnosis not present

## 2024-06-05 ENCOUNTER — Encounter (HOSPITAL_BASED_OUTPATIENT_CLINIC_OR_DEPARTMENT_OTHER): Payer: Self-pay

## 2024-06-05 ENCOUNTER — Emergency Department (HOSPITAL_BASED_OUTPATIENT_CLINIC_OR_DEPARTMENT_OTHER)
Admission: EM | Admit: 2024-06-05 | Discharge: 2024-06-05 | Disposition: A | Discharge status: Home / Self Care | Attending: Emergency Medicine | Admitting: Emergency Medicine

## 2024-06-05 ENCOUNTER — Other Ambulatory Visit: Payer: Self-pay

## 2024-06-05 DIAGNOSIS — Z794 Long term (current) use of insulin: Secondary | ICD-10-CM | POA: Diagnosis not present

## 2024-06-05 DIAGNOSIS — Z7901 Long term (current) use of anticoagulants: Secondary | ICD-10-CM | POA: Insufficient documentation

## 2024-06-05 DIAGNOSIS — E1165 Type 2 diabetes mellitus with hyperglycemia: Secondary | ICD-10-CM | POA: Insufficient documentation

## 2024-06-05 DIAGNOSIS — E860 Dehydration: Secondary | ICD-10-CM | POA: Insufficient documentation

## 2024-06-05 DIAGNOSIS — R739 Hyperglycemia, unspecified: Secondary | ICD-10-CM

## 2024-06-05 DIAGNOSIS — Z79899 Other long term (current) drug therapy: Secondary | ICD-10-CM | POA: Insufficient documentation

## 2024-06-05 DIAGNOSIS — I10 Essential (primary) hypertension: Secondary | ICD-10-CM | POA: Insufficient documentation

## 2024-06-05 LAB — BASIC METABOLIC PANEL WITH GFR
Anion gap: 12 (ref 5–15)
BUN: 20 mg/dL (ref 8–23)
CO2: 24 mmol/L (ref 22–32)
Calcium: 9.4 mg/dL (ref 8.9–10.3)
Chloride: 100 mmol/L (ref 98–111)
Creatinine, Ser: 1.63 mg/dL — ABNORMAL HIGH (ref 0.61–1.24)
GFR, Estimated: 43 mL/min — ABNORMAL LOW (ref >60)
Glucose, Bld: 323 mg/dL — ABNORMAL HIGH (ref 70–99)
Potassium: 4.3 mmol/L (ref 3.5–5.1)
Sodium: 136 mmol/L (ref 135–145)

## 2024-06-05 LAB — CBC
HCT: 40.5 % (ref 39.0–52.0)
Hemoglobin: 13.2 g/dL (ref 13.0–17.0)
MCH: 27.7 pg (ref 26.0–34.0)
MCHC: 32.6 g/dL (ref 30.0–36.0)
MCV: 84.9 fL (ref 80.0–100.0)
Platelets: 171 K/uL (ref 150–400)
RBC: 4.77 MIL/uL (ref 4.22–5.81)
RDW: 13.4 % (ref 11.5–15.5)
WBC: 7.1 K/uL (ref 4.0–10.5)
nRBC: 0 % (ref 0.0–0.2)

## 2024-06-05 LAB — URINALYSIS, ROUTINE W REFLEX MICROSCOPIC
Bacteria, UA: NONE SEEN
Bilirubin Urine: NEGATIVE
Glucose, UA: >1000 mg/dL — AB
Hgb urine dipstick: NEGATIVE
Ketones, ur: NEGATIVE mg/dL
Leukocytes,Ua: NEGATIVE
Nitrite: NEGATIVE
Protein, ur: NEGATIVE mg/dL
Specific Gravity, Urine: 1.008 (ref 1.005–1.030)
pH: 5 (ref 5.0–8.0)

## 2024-06-05 LAB — CBG MONITORING, ED
Glucose-Capillary: 305 mg/dL — ABNORMAL HIGH (ref 70–99)
Glucose-Capillary: 300 mg/dL — ABNORMAL HIGH (ref 70–99)

## 2024-06-05 NOTE — ED Provider Notes (Signed)
 [place] EMERGENCY DEPARTMENT AT [facility] Provider Note   CSN: [id number] Arrival date & time: 06/05/24  2129     Patient presents with: Hyperglycemia   Peter Walker is a 78 y.o. male.   The history is provided by the patient, the spouse and medical records. No language interpreter was used.  Hyperglycemia    78 year old male history of diabetes, hypertension, PAD, CVA, presenting with concerns of elevated blood sugar.  Patient states his blood sugars usually very well-controlled however today when his wife checked his blood sugar it was 337 which concerned him.  He has not taken his insulin quite yet but decided to come to the ER for further assessment.  He also mention he has history of L4-L5 disc disease that occasionally caused him to have back pain.  Today his back pain was quite intense and he did take some Tylenol .  Wife is unsure if the Tylenol  may have caused the elevation of his blood sugar.  Otherwise patient denies any new medication changes no new infection no urinary discomfort no fever chills nausea vomiting diarrhea.  Patient attributes his back pain to overexerting himself this past week at work.  Prior to Admission medications   Medication Sig Start Date End Date Taking? Authorizing Provider  amLODipine (NORVASC) 5 MG tablet Take 5 mg by mouth daily.    [provider]  ascorbic acid (VITAMIN C) 500 MG tablet Take 500 mg by mouth daily.    [provider]  celecoxib (CELEBREX) 200 MG capsule Take 200 mg by mouth 2 (two) times daily as needed for mild pain.  09/02/16   [provider]  clopidogrel  (PLAVIX ) 75 MG tablet TAKE 1 TABLET(75 MG) BY MOUTH DAILY 02/08/24   Sheree Penne Bruckner, MD  Coenzyme Q10 (COQ10) 400 MG CAPS Take 400 mg by mouth daily.    [provider]  diclofenac Sodium (VOLTAREN) 1 % GEL Apply 1 application topically 4 (four) times daily as needed (pain).    [provider]  insulin  NPH-regular Human (70-30) 100 UNIT/ML injection Inject 20-70 Units into the skin See admin instructions. Inject 70 units in the morning and 20 units in the evening    [provider]  JANUVIA 100 MG tablet Take 100 mg by mouth daily.  08/30/16   [provider]  losartan-hydrochlorothiazide (HYZAAR) 100-12.5 MG tablet Take 1 tablet by mouth daily.  08/30/16   [provider]  Naproxen Sod-diphenhydrAMINE  (ALEVE PM) 220-25 MG TABS Take 2 tablets by mouth at bedtime as needed (sleep).    [provider]  omeprazole (PRILOSEC) 20 MG capsule Take 20 mg by mouth daily.    [provider]  RELION INSULIN SYRINGE 1ML/31G 31G X 5/16 1 ML MISC  05/01/17   [provider]  rosuvastatin (CRESTOR) 20 MG tablet Take 20 mg by mouth daily.  11/14/17   [provider]    Allergies: Empagliflozin and Metformin hcl    Review of Systems  All other systems reviewed and are negative.   Updated Vital Signs BP (!) 136/98   Pulse 65   Temp 98.2 F (36.8 C) (Oral)   Resp 16   SpO2 99%   Physical Exam Constitutional:      General: He is not in acute distress.    Appearance: He is well-developed.  HENT:     Head: Atraumatic.  Eyes:     Conjunctiva/sclera: Conjunctivae normal.  Cardiovascular:     Rate and Rhythm: Normal  rate and regular rhythm.     Pulses: Normal pulses.     Heart sounds: Normal heart sounds.  Pulmonary:     Effort: Pulmonary effort is normal.     Breath sounds: Normal breath sounds.  Abdominal:     Palpations: Abdomen is soft.     Tenderness: There is no abdominal tenderness.  Musculoskeletal:        General: No tenderness (No significant midline spine tenderness).     Cervical back: Normal range of motion and neck supple.  Skin:    Findings: No rash.  Neurological:     Mental Status: He is alert.     (all labs ordered are listed, but only abnormal results are displayed) Labs Reviewed  BASIC METABOLIC PANEL WITH  GFR - Abnormal; Notable for the following components:      Result Value   Glucose, Bld 323 (*)    Creatinine, Ser 1.63 (*)    GFR, Estimated 43 (*)    All other components within normal limits  URINALYSIS, ROUTINE W REFLEX MICROSCOPIC - Abnormal; Notable for the following components:   Color, Urine COLORLESS (*)    Glucose, UA >1,000 (*)    All other components within normal limits  CBG MONITORING, ED - Abnormal; Notable for the following components:   Glucose-Capillary 305 (*)    All other components within normal limits  CBG MONITORING, ED - Abnormal; Notable for the following components:   Glucose-Capillary 300 (*)    All other components within normal limits  CBC    EKG: None  Radiology: No results found.   Procedures   Medications Ordered in the ED - No data to display                                  Medical Decision Making Amount and/or Complexity of Data Reviewed Labs: ordered.   BP (!) 136/98   Pulse 65   Temp 98.2 F (36.8 C) (Oral)   Resp 16   SpO2 99%   36:46 PM  78 year old male history of diabetes, hypertension, PAD, CVA, presenting with concerns of elevated blood sugar.  Patient states his blood sugars usually very well-controlled however today when his wife checked his blood sugar it was 337 which concerned him.  He has not taken his insulin quite yet but decided to come to the ER for further assessment.  He also mention he has history of L4-L5 disc disease that occasionally caused him to have back pain.  Today his back pain was quite intense and he did take some Tylenol .  Wife is unsure if the Tylenol  may have caused the elevation of his blood sugar.  Otherwise patient denies any new medication changes no new infection no urinary discomfort no fever chills nausea vomiting diarrhea.  Patient attributes his back pain to overexerting himself this past week at work.  On exam patient is resting comfortably appears to be in no acute discomfort.  He does not  have any second tenderness to his lumbar spine on palpation.  He is moving all 4 extremities equally.  He is mentating appropriately.  Vital signs overall reassuring.  Suspect hyperglycemia likely in the setting of pain to his lower back causing a stress response.  Will give patient insulin and woke up patient.  -Labs ordered, independently viewed and interpreted by me.  Labs remarkable for CBG of 323, mild AKI with a creatinine of 1.63, urinalysis  without signs of ketone.  No evidence of DKA. -The patient was maintained on a cardiac monitor.  I personally viewed and interpreted the cardiac monitored which showed an underlying rhythm of: Sinus bradycardia -This patient presents to the ED for concern of elevated blood sugar, this involves an extensive number of treatment options, and is a complaint that carries with it a high risk of complications and morbidity.  The differential diagnosis includes stress response, DKA, HSS, hypoglycemia, infection -Co morbidities that complicate the patient evaluation includes diabetes, hypertension -Treatment includes home insulin -Reevaluation of the patient after these medicines showed that the patient stayed the same -PCP office notes or outside notes reviewed -Discussion with attending Dr. Armenta -Escalation to admission/observation considered: patients feels much better, is comfortable with discharge, and will follow up with PCP -Prescription medication considered, patient comfortable with home medication -Social Determinant of Health considered  Patient decided to use his home insulin instead of insulin that I offered.  After waiting for period of time on recheck CBG is 300.  He also has evidence of dehydration with creatinine of 1.63.  I offered IV fluid as that should help with his dehydration as well as help with his elevated blood sugar.  Patient states he prefers to go home and drink fluid instead and will follow-up closely with his doctor for further  management of his hyperglycemia.  I agrees with this plan.  Patient stable for discharge.      Final diagnoses:  Hyperglycemia  Dehydration    ED Discharge Orders     None          Nivia Colon, PA-C 06/05/24 2338    Armenta Canning, MD 06/12/24 [phone]

## 2024-06-05 NOTE — Discharge Instructions (Signed)
 Your blood sugar is a bit elevated today.  This could be a result related to a stress response from your back pain.  You also have signs of dehydration.  Please drink plenty of fluid, continue to take your insulin and follow-up closely with your doctor for further care.  No evidence of DKA on today's visit.

## 2024-06-05 NOTE — ED Triage Notes (Addendum)
 Patient reports blood glucose of 337. He states he is DMII. He usually takes insulin for it at home. No dose taken prior to arrival at ER. He states his usual blood glucose is 180 at the most.   He reports he also has bad some back pain from over doing it this week at work.

## 2024-06-15 DIAGNOSIS — M5441 Lumbago with sciatica, right side: Secondary | ICD-10-CM | POA: Diagnosis not present

## 2024-06-15 DIAGNOSIS — G8929 Other chronic pain: Secondary | ICD-10-CM | POA: Diagnosis not present

## 2024-06-15 DIAGNOSIS — G5601 Carpal tunnel syndrome, right upper limb: Secondary | ICD-10-CM | POA: Diagnosis not present

## 2024-06-19 DIAGNOSIS — G8929 Other chronic pain: Secondary | ICD-10-CM | POA: Diagnosis not present

## 2024-06-19 DIAGNOSIS — M5416 Radiculopathy, lumbar region: Secondary | ICD-10-CM | POA: Diagnosis not present

## 2024-06-19 DIAGNOSIS — E119 Type 2 diabetes mellitus without complications: Secondary | ICD-10-CM | POA: Diagnosis not present

## 2024-06-19 DIAGNOSIS — M5441 Lumbago with sciatica, right side: Secondary | ICD-10-CM | POA: Diagnosis not present

## 2024-06-23 DIAGNOSIS — M48062 Spinal stenosis, lumbar region with neurogenic claudication: Secondary | ICD-10-CM | POA: Diagnosis not present

## 2024-06-23 DIAGNOSIS — E1165 Type 2 diabetes mellitus with hyperglycemia: Secondary | ICD-10-CM | POA: Diagnosis not present

## 2024-06-28 ENCOUNTER — Emergency Department (HOSPITAL_BASED_OUTPATIENT_CLINIC_OR_DEPARTMENT_OTHER)
Admission: EM | Admit: 2024-06-28 | Discharge: 2024-06-28 | Disposition: A | Discharge status: Home / Self Care | Source: Ambulatory Visit | Attending: Emergency Medicine | Admitting: Emergency Medicine

## 2024-06-28 ENCOUNTER — Other Ambulatory Visit: Payer: Self-pay

## 2024-06-28 ENCOUNTER — Other Ambulatory Visit: Payer: Self-pay | Admitting: Family Medicine

## 2024-06-28 ENCOUNTER — Emergency Department (HOSPITAL_BASED_OUTPATIENT_CLINIC_OR_DEPARTMENT_OTHER)

## 2024-06-28 DIAGNOSIS — R000 Tachycardia, unspecified: Secondary | ICD-10-CM | POA: Diagnosis not present

## 2024-06-28 DIAGNOSIS — E119 Type 2 diabetes mellitus without complications: Secondary | ICD-10-CM | POA: Diagnosis not present

## 2024-06-28 DIAGNOSIS — Z794 Long term (current) use of insulin: Secondary | ICD-10-CM | POA: Insufficient documentation

## 2024-06-28 DIAGNOSIS — R7309 Other abnormal glucose: Secondary | ICD-10-CM | POA: Diagnosis not present

## 2024-06-28 DIAGNOSIS — Z7902 Long term (current) use of antithrombotics/antiplatelets: Secondary | ICD-10-CM | POA: Diagnosis not present

## 2024-06-28 DIAGNOSIS — N289 Disorder of kidney and ureter, unspecified: Secondary | ICD-10-CM | POA: Diagnosis not present

## 2024-06-28 DIAGNOSIS — Z7984 Long term (current) use of oral hypoglycemic drugs: Secondary | ICD-10-CM | POA: Insufficient documentation

## 2024-06-28 DIAGNOSIS — M48062 Spinal stenosis, lumbar region with neurogenic claudication: Secondary | ICD-10-CM

## 2024-06-28 DIAGNOSIS — K56609 Unspecified intestinal obstruction, unspecified as to partial versus complete obstruction: Secondary | ICD-10-CM | POA: Diagnosis not present

## 2024-06-28 DIAGNOSIS — I10 Essential (primary) hypertension: Secondary | ICD-10-CM | POA: Diagnosis not present

## 2024-06-28 DIAGNOSIS — K5900 Constipation, unspecified: Secondary | ICD-10-CM | POA: Diagnosis not present

## 2024-06-28 DIAGNOSIS — K8020 Calculus of gallbladder without cholecystitis without obstruction: Secondary | ICD-10-CM | POA: Diagnosis not present

## 2024-06-28 DIAGNOSIS — R112 Nausea with vomiting, unspecified: Secondary | ICD-10-CM | POA: Diagnosis not present

## 2024-06-28 LAB — CBC
HCT: 42.2 % (ref 39.0–52.0)
Hemoglobin: 14.1 g/dL (ref 13.0–17.0)
MCH: 27.7 pg (ref 26.0–34.0)
MCHC: 33.4 g/dL (ref 30.0–36.0)
MCV: 82.9 fL (ref 80.0–100.0)
Platelets: 172 K/uL (ref 150–400)
RBC: 5.09 MIL/uL (ref 4.22–5.81)
RDW: 13.1 % (ref 11.5–15.5)
WBC: 8.8 K/uL (ref 4.0–10.5)
nRBC: 0 % (ref 0.0–0.2)

## 2024-06-28 LAB — COMPREHENSIVE METABOLIC PANEL WITH GFR
ALT: 20 U/L (ref 0–44)
AST: 21 U/L (ref 15–41)
Albumin: 4.5 g/dL (ref 3.5–5.0)
Alkaline Phosphatase: 99 U/L (ref 38–126)
Anion gap: 15 (ref 5–15)
BUN: 19 mg/dL (ref 8–23)
CO2: 23 mmol/L (ref 22–32)
Calcium: 9.9 mg/dL (ref 8.9–10.3)
Chloride: 95 mmol/L — ABNORMAL LOW (ref 98–111)
Creatinine, Ser: 1.45 mg/dL — ABNORMAL HIGH (ref 0.61–1.24)
GFR, Estimated: 50 mL/min — ABNORMAL LOW (ref >60)
Glucose, Bld: 249 mg/dL — ABNORMAL HIGH (ref 70–99)
Potassium: 4.2 mmol/L (ref 3.5–5.1)
Sodium: 133 mmol/L — ABNORMAL LOW (ref 135–145)
Total Bilirubin: 0.6 mg/dL (ref 0.0–1.2)
Total Protein: 7.5 g/dL (ref 6.5–8.1)

## 2024-06-28 LAB — URINALYSIS, ROUTINE W REFLEX MICROSCOPIC
Bacteria, UA: NONE SEEN
Bilirubin Urine: NEGATIVE
Glucose, UA: >1000 mg/dL — AB
Leukocytes,Ua: NEGATIVE
Nitrite: NEGATIVE
Protein, ur: 100 mg/dL — AB
Specific Gravity, Urine: 1.016 (ref 1.005–1.030)
pH: 5.5 (ref 5.0–8.0)

## 2024-06-28 LAB — CBG MONITORING, ED: Glucose-Capillary: 237 mg/dL — ABNORMAL HIGH (ref 70–99)

## 2024-06-28 LAB — MAGNESIUM: Magnesium: 2.1 mg/dL (ref 1.7–2.4)

## 2024-06-28 LAB — LIPASE, BLOOD: Lipase: 21 U/L (ref 11–51)

## 2024-06-28 MED ORDER — IOHEXOL 300 MG/ML  SOLN
100.0000 mL | Freq: Once | INTRAMUSCULAR | Status: AC | PRN
Start: 2024-06-28 — End: 2024-06-28
  Administered 2024-06-28: 85 mL via INTRAVENOUS

## 2024-06-28 MED ORDER — LACTATED RINGERS IV BOLUS
1000.0000 mL | Freq: Once | INTRAVENOUS | Status: AC
  Administered 2024-06-28: 1000 mL via INTRAVENOUS

## 2024-06-28 MED ORDER — ONDANSETRON HCL 4 MG/2ML IJ SOLN
4.0000 mg | Freq: Once | INTRAMUSCULAR | Status: DC
  Filled 2024-06-28: qty 2

## 2024-06-28 MED ORDER — BISACODYL 10 MG RE SUPP: 10.0000 mg | RECTAL | 0 refills | Status: AC | PRN

## 2024-06-28 MED ORDER — POLYETHYLENE GLYCOL 3350 17 G PO PACK: 17.0000 g | PACK | Freq: Two times a day (BID) | ORAL | 0 refills | Status: AC

## 2024-06-28 MED ORDER — ONDANSETRON 4 MG PO TBDP: 4.0000 mg | ORAL_TABLET | Freq: Three times a day (TID) | ORAL | 0 refills | Status: AC | PRN

## 2024-06-28 NOTE — Discharge Instructions (Addendum)
 You were seen for your constipation in the emergency department.   At home, please take MiraLAX  twice daily.  You may use the suppositories we have given you if you do not have a bowel movement after that.  Take Zofran  for nausea and vomiting  Check your MyChart online for the results of any tests that had not resulted by the time you left the emergency department.   Follow-up with your primary doctor in 2-3 days regarding your visit.    Return immediately to the emergency department if you experience any of the following: Worsening pain, vomiting spite medication, or any other concerning symptoms.    Thank you for visiting our Emergency Department. It was a pleasure taking care of you today.

## 2024-06-28 NOTE — ED Notes (Signed)
 Pt refused zofran  at this time. Reports he does not feel nauseous right now and will let RN know if nausea returns.

## 2024-06-28 NOTE — ED Notes (Signed)
DC by previous Charity fundraiserN.

## 2024-06-28 NOTE — ED Provider Notes (Signed)
 [place] EMERGENCY DEPARTMENT AT [facility] Provider Note   CSN: [id number] Arrival date & time: 06/28/24  1129     Patient presents with: Constipation   Peter Walker is a 78 y.o. male.   78 year old male with a history of insulin-dependent diabetes who presents to the emergency department with constipation and vomiting.  History obtained per patient and his wife.  Over the past 2 weeks has been constipated.  Has been having to push hard to get his stool out. Having to push is starting to cause some abdominal pain.  Went to urgent care and had an x-ray that appeared to be concerning for bowel obstruction and was sent here.  Still passing gas.  Last actual bowel movement was a week ago.  Has not tried any laxatives.  Also was told that he had a rapid heart rate at 1 point in time but denies any chest pain or palpitations or shortness of breath.  No abdominal surgeries       Prior to Admission medications   Medication Sig Start Date End Date Taking? Authorizing Provider  bisacodyl  (DULCOLAX) 10 MG suppository Place 1 suppository (10 mg total) rectally as needed for moderate constipation. 06/28/24  Yes Yolande Lamar BROCKS, MD  ondansetron  (ZOFRAN -ODT) 4 MG disintegrating tablet Take 1 tablet (4 mg total) by mouth every 8 (eight) hours as needed for nausea or vomiting. 06/28/24  Yes Yolande Lamar BROCKS, MD  polyethylene glycol (MIRALAX ) 17 g packet Take 17 g by mouth 2 (two) times daily. 06/28/24  Yes Yolande Lamar BROCKS, MD  amLODipine (NORVASC) 5 MG tablet Take 5 mg by mouth daily.    [provider]  ascorbic acid (VITAMIN C) 500 MG tablet Take 500 mg by mouth daily.    [provider]  celecoxib (CELEBREX) 200 MG capsule Take 200 mg by mouth 2 (two) times daily as needed for mild pain.  09/02/16   [provider]  clopidogrel  (PLAVIX ) 75 MG tablet TAKE 1 TABLET(75 MG) BY MOUTH DAILY 02/08/24   Sheree Penne Bruckner, MD  Coenzyme Q10 (COQ10) 400 MG CAPS  Take 400 mg by mouth daily.    [provider]  diclofenac Sodium (VOLTAREN) 1 % GEL Apply 1 application topically 4 (four) times daily as needed (pain).    [provider]  insulin NPH-regular Human (70-30) 100 UNIT/ML injection Inject 20-70 Units into the skin See admin instructions. Inject 70 units in the morning and 20 units in the evening    [provider]  JANUVIA 100 MG tablet Take 100 mg by mouth daily.  08/30/16   [provider]  losartan-hydrochlorothiazide (HYZAAR) 100-12.5 MG tablet Take 1 tablet by mouth daily.  08/30/16   [provider]  Naproxen Sod-diphenhydrAMINE  (ALEVE PM) 220-25 MG TABS Take 2 tablets by mouth at bedtime as needed (sleep).    [provider]  NOVOLOG MIX 70/30 FLEXPEN (70-30) 100 UNIT/ML FlexPen     [provider]  omeprazole (PRILOSEC) 20 MG capsule Take 20 mg by mouth daily.    [provider]  RELION INSULIN SYRINGE 1ML/31G 31G X 5/16 1 ML MISC  05/01/17   [provider]  rosuvastatin (CRESTOR) 20 MG tablet Take 20 mg by mouth daily.  11/14/17   [provider]    Allergies: Empagliflozin and Metformin hcl    Review of Systems  Updated Vital Signs BP 128/66 (BP Location: Right Arm)   Pulse 96   Temp 98.7 F (37.1 C) (  Oral)   Resp 18   SpO2 97%   Physical Exam Vitals and nursing note reviewed.  Constitutional:      General: He is not in acute distress.    Appearance: He is well-developed.  HENT:     Head: Normocephalic and atraumatic.     Right Ear: External ear normal.     Left Ear: External ear normal.     Nose: Nose normal.  Eyes:     Extraocular Movements: Extraocular movements intact.     Conjunctiva/sclera: Conjunctivae normal.     Pupils: Pupils are equal, round, and reactive to light.  Cardiovascular:     Rate and Rhythm: Normal rate and regular rhythm.     Heart sounds: Normal heart sounds.     Comments: Occasional PVCs Pulmonary:      Effort: Pulmonary effort is normal. No respiratory distress.     Breath sounds: Normal breath sounds.  Abdominal:     General: There is no distension.     Palpations: Abdomen is soft. There is no mass.     Tenderness: There is no abdominal tenderness. There is no guarding.  Musculoskeletal:     Cervical back: Normal range of motion and neck supple.  Skin:    General: Skin is warm and dry.  Neurological:     Mental Status: He is alert. Mental status is at baseline.  Psychiatric:        Mood and Affect: Mood normal.        Behavior: Behavior normal.     (all labs ordered are listed, but only abnormal results are displayed) Labs Reviewed  COMPREHENSIVE METABOLIC PANEL WITH GFR - Abnormal; Notable for the following components:      Result Value   Sodium 133 (*)    Chloride 95 (*)    Glucose, Bld 249 (*)    Creatinine, Ser 1.45 (*)    GFR, Estimated 50 (*)    All other components within normal limits  URINALYSIS, ROUTINE W REFLEX MICROSCOPIC - Abnormal; Notable for the following components:   Glucose, UA >1,000 (*)    Hgb urine dipstick SMALL (*)    Ketones, ur TRACE (*)    Protein, ur 100 (*)    All other components within normal limits  CBG MONITORING, ED - Abnormal; Notable for the following components:   Glucose-Capillary 237 (*)    All other components within normal limits  LIPASE, BLOOD  CBC  MAGNESIUM    EKG: EKG Interpretation Date/Time:  Wednesday June 28 2024 11:48:22 EDT Ventricular Rate:  99 PR Interval:  118 QRS Duration:  72 QT Interval:  342 QTC Calculation: 438 R Axis:   39  Text Interpretation: Sinus rhythm with occasional Premature ventricular complexes Otherwise normal ECG When compared with ECG of 25-Mar-2017 07:51, Premature ventricular complexes are now Present Vent. rate has increased BY  36 BPM Confirmed by Yolande Charleston [phone]) on 06/28/2024 8:21:25 PM  Radiology: CT ABDOMEN PELVIS W CONTRAST Result Date: 06/28/2024 CLINICAL DATA:   Constipation and dizziness. Tachycardia. Possible bowel obstruction on x-ray. EXAM: CT ABDOMEN AND PELVIS WITH CONTRAST TECHNIQUE: Multidetector CT imaging of the abdomen and pelvis was performed using the standard protocol following bolus administration of intravenous contrast. RADIATION DOSE REDUCTION: This exam was performed according to the departmental dose-optimization program which includes automated exposure control, adjustment of the mA and/or kV according to patient size and/or use of iterative reconstruction technique. CONTRAST:  85mL OMNIPAQUE  IOHEXOL  300 MG/ML  SOLN COMPARISON:  Abdominal radiographs  same day. FINDINGS: Lower chest: Calcified granulomas. No acute findings. Atherosclerotic calcification of the aorta, aortic valve and coronary arteries. Heart size normal. No pericardial effusion. No pleural effusion. Distal esophagus is grossly unremarkable. Hepatobiliary: Liver is grossly unremarkable. Tiny gallstones. No biliary ductal dilatation. Pancreas: Negative. Spleen: Negative. Adrenals/Urinary Tract: Adrenal glands are unremarkable. Small low-attenuation lesions in the kidneys. No specific follow-up necessary. Left kidney is somewhat atrophic. Kidneys are otherwise unremarkable. Ureters are decompressed. Bladder is grossly unremarkable. Stomach/Bowel: Stomach, small bowel and appendix are unremarkable. Moderate stool burden. Vascular/Lymphatic: Atherosclerotic calcification of the aorta. No pathologically enlarged lymph nodes. Bilateral common iliac artery stents. Reproductive: Prostate is normal in size. Other: No free fluid. Tiny bilateral inguinal hernias contain fat. Mesenteries and peritoneum are unremarkable. Elevated left hemidiaphragm. Musculoskeletal: Degenerative changes in the spine. Slight retrolisthesis of L2 on L3 and L5 on S1. IMPRESSION: 1. No acute findings. 2. Moderate stool burden. 3. Tiny gallstones. 4. Aortic atherosclerosis (ICD10-I70.0). Coronary artery calcification.  Electronically Signed   By: Newell Eke M.D.   On: 06/28/2024 13:57     Procedures   Medications Ordered in the ED  ondansetron  (ZOFRAN ) injection 4 mg (has no administration in time range)  lactated ringers  bolus 1,000 mL (0 mLs Intravenous Stopped 06/28/24 1444)  iohexol  (OMNIPAQUE ) 300 MG/ML solution 100 mL (85 mLs Intravenous Contrast Given 06/28/24 1309)                                    Medical Decision Making Amount and/or Complexity of Data Reviewed Labs: ordered. Radiology: ordered.  Risk OTC drugs. Prescription drug management.   AUTHUR CUBIT is a 78 year old male insulin-dependent diabetes who presents with constipation and vomiting  Initial Ddx:  Constipation, gastroenteritis, pancreatitis, bowel obstruction, ileus  MDM/Course:  Patient presents emergency department with constipation for the past 2 weeks.  Last bowel movement was a week ago.  Still passing gas and not having any abdominal pain.  No abdominal surgeries.  Did have an episode of vomiting prior to arrival.  Santina to urgent care and had an x-ray that showed possible obstruction.  CT here does not show evidence of obstruction but it did show moderate constipation.  His lab work is unremarkable.  Upon re-evaluation patient vomiting had stopped and nausea resolved.  Offered patient enema but he declined.  Will send him home with MiraLAX  and suppositories and instructions to follow-up with his primary doctor.  Terms of his irregular heart beat at urgent care unfortunately I do not have a record of what they actually saw.  Appears to be in a sinus rhythm with occasional PVCs here.  His EKG is reassuring.  Counseled to return should he develop any palpitations or other concerning symptoms  This patient presents to the ED for concern of complaints listed in HPI, this involves an extensive number of treatment options, and is a complaint that carries with it a high risk of complications and morbidity. Disposition  including potential need for admission considered.   Dispo: DC Home. Return precautions discussed including, but not limited to, those listed in the AVS. Allowed pt time to ask questions which were answered fully prior to dc.  Additional history obtained from spouse Records reviewed Outpatient Clinic Notes The following labs were independently interpreted: Chemistry and show Hyperglycemia I independently reviewed the following imaging with scope of interpretation limited to determining acute life threatening conditions related to emergency care: CT  Abdomen/Pelvis and agree with the radiologist interpretation with the following exceptions: none I personally reviewed and interpreted cardiac monitoring: normal sinus rhythm  and with occasional PVCs I personally reviewed and interpreted the pt's EKG: see above for interpretation  I have reviewed the patients home medications and made adjustments as needed Social Determinants of health:  Geriatric  Portions of this note were generated with Scientist, clinical (histocompatibility and immunogenetics). Dictation errors may occur despite best attempts at proofreading.     Final diagnoses:  Constipation, unspecified constipation type  Nausea and vomiting, unspecified vomiting type    ED Discharge Orders          Ordered    ondansetron  (ZOFRAN -ODT) 4 MG disintegrating tablet  Every 8 hours PRN        06/28/24 1423    polyethylene glycol (MIRALAX ) 17 g packet  2 times daily        06/28/24 2019    bisacodyl  (DULCOLAX) 10 MG suppository  As needed        06/28/24 2019               Yolande Lamar BROCKS, MD 06/28/24 2023

## 2024-06-28 NOTE — ED Triage Notes (Signed)
 Pt caox4 from walk-in clinic c/o constipation and dizziness x2 weeks, reports walk-in clinic was concerned for increased HR, had abd XR done there, told pt to go to ED d/t concern for bowel obstruction.

## 2024-06-30 DIAGNOSIS — I10 Essential (primary) hypertension: Secondary | ICD-10-CM | POA: Diagnosis not present

## 2024-06-30 DIAGNOSIS — E7800 Pure hypercholesterolemia, unspecified: Secondary | ICD-10-CM | POA: Diagnosis not present

## 2024-06-30 DIAGNOSIS — I739 Peripheral vascular disease, unspecified: Secondary | ICD-10-CM | POA: Diagnosis not present

## 2024-06-30 DIAGNOSIS — E1165 Type 2 diabetes mellitus with hyperglycemia: Secondary | ICD-10-CM | POA: Diagnosis not present

## 2024-06-30 DIAGNOSIS — E1121 Type 2 diabetes mellitus with diabetic nephropathy: Secondary | ICD-10-CM | POA: Diagnosis not present

## 2024-06-30 DIAGNOSIS — G629 Polyneuropathy, unspecified: Secondary | ICD-10-CM | POA: Diagnosis not present

## 2024-07-12 ENCOUNTER — Ambulatory Visit
Admission: RE | Admit: 2024-07-12 | Discharge: 2024-07-12 | Disposition: A | Source: Ambulatory Visit | Attending: Family Medicine

## 2024-07-12 DIAGNOSIS — M47817 Spondylosis without myelopathy or radiculopathy, lumbosacral region: Secondary | ICD-10-CM | POA: Diagnosis not present

## 2024-07-12 DIAGNOSIS — M48062 Spinal stenosis, lumbar region with neurogenic claudication: Secondary | ICD-10-CM

## 2024-07-12 DIAGNOSIS — M5126 Other intervertebral disc displacement, lumbar region: Secondary | ICD-10-CM | POA: Diagnosis not present

## 2024-07-12 DIAGNOSIS — M48061 Spinal stenosis, lumbar region without neurogenic claudication: Secondary | ICD-10-CM | POA: Diagnosis not present

## 2024-07-27 DIAGNOSIS — N1831 Chronic kidney disease, stage 3a: Secondary | ICD-10-CM | POA: Diagnosis not present

## 2024-07-27 DIAGNOSIS — I10 Essential (primary) hypertension: Secondary | ICD-10-CM | POA: Diagnosis not present

## 2024-07-27 DIAGNOSIS — E1121 Type 2 diabetes mellitus with diabetic nephropathy: Secondary | ICD-10-CM | POA: Diagnosis not present

## 2024-08-08 ENCOUNTER — Telehealth (INDEPENDENT_AMBULATORY_CARE_PROVIDER_SITE_OTHER): Payer: Self-pay

## 2024-08-08 NOTE — Telephone Encounter (Signed)
 Pt wife called asking for us  to get him scheduled an APPT he is having issues with his ears

## 2024-08-09 ENCOUNTER — Ambulatory Visit (INDEPENDENT_AMBULATORY_CARE_PROVIDER_SITE_OTHER): Admitting: Otolaryngology

## 2024-08-09 ENCOUNTER — Encounter (INDEPENDENT_AMBULATORY_CARE_PROVIDER_SITE_OTHER): Payer: Self-pay | Admitting: Otolaryngology

## 2024-08-09 VITALS — BP 144/67 | HR 72 | Ht 71.0 in | Wt 201.0 lb

## 2024-08-09 DIAGNOSIS — H6123 Impacted cerumen, bilateral: Secondary | ICD-10-CM

## 2024-08-09 DIAGNOSIS — H9312 Tinnitus, left ear: Secondary | ICD-10-CM | POA: Diagnosis not present

## 2024-08-09 DIAGNOSIS — H6982 Other specified disorders of Eustachian tube, left ear: Secondary | ICD-10-CM

## 2024-08-09 NOTE — Progress Notes (Signed)
 Patient ID: Peter Walker, male   DOB: 12-Jun-1946, 78 y.o.   MRN: [id number]  Follow up: Nasal congestion, clogging sensation in the left ear, left ear hearing loss and tinnitus  Follow up:   Discussed the use of AI scribe software for clinical note transcription with the patient, who gave verbal consent to proceed.  History of Present Illness Peter Walker is a 78 year old male with a history of left ear issues who presents with recurrent ear clogging and hearing loss.  He has experienced recurrent clogging in his left ear, which initially improved after treatment but has returned over the past eight to nine months. The ear feels 'monotone' and he cannot hear well. He has not used Flonase nasal spray recently, which he had used in the past. He attempted to use peroxide for relief, but it was ineffective. He had a tube placed in the left ear in 2019.  He describes a history of traveling frequently for his participation in the professional bowlers tour, which sometimes exacerbated his ear issues, particularly during flights. He has not flown in over a year but recalls past experiences where flying affected his ears.  He experiences constant ringing in the left ear. The ringing is persistent and bothersome.  He owns a pest control business and has a flexible schedule, allowing him to manage his work commitments around his medical appointments. No nasal congestion and he can breathe through his nose without difficulty.     Exam: General: Communicates without difficulty, well nourished, no acute distress. Head: Normocephalic, no evidence injury, no tenderness, facial buttresses intact without stepoff. Face/sinus: No tenderness to palpation and percussion. Facial movement is normal and symmetric. Eyes: PERRL, EOMI. No scleral icterus, conjunctivae clear. Neuro: CN II exam reveals vision grossly intact.  No nystagmus at any point of gaze. Ears: Auricles well formed without lesions.  Ear canals  are intact without mass or lesion.  No erythema or edema is appreciated.  The TMs are intact without fluid. Nose: External evaluation reveals normal support and skin without lesions.  Dorsum is intact.  Anterior rhinoscopy reveals congested mucosa over anterior aspect of inferior turbinates and intact septum.  No purulence noted. Oral:  Oral cavity and oropharynx are intact, symmetric, without erythema or edema.  Mucosa is moist without lesions. Neck: Full range of motion without pain.  There is no significant lymphadenopathy.  No masses palpable.  Thyroid  bed within normal limits to palpation.  Parotid glands and submandibular glands equal bilaterally without mass.  Trachea is midline. Neuro:  CN 2-12 grossly intact.    Assessment and Plan Assessment & Plan Bilateral cerumen impaction Significant cerumen impaction in the left ear, causing hearing difficulties. The right ear also has some wax, but less severe. No signs of infection. Wax removal improved hearing. - Removed cerumen from both ears  Left ear tinnitus with associated hearing loss Persistent tinnitus in the left ear, likely related to hearing loss. Tinnitus may improve with cerumen removal. Hearing test needed to assess changes in hearing loss. - Scheduled hearing test to assess hearing loss and tinnitus - Will review hearing test results to determine further management

## 2024-08-10 DIAGNOSIS — H9312 Tinnitus, left ear: Secondary | ICD-10-CM | POA: Insufficient documentation

## 2024-08-10 DIAGNOSIS — H6982 Other specified disorders of Eustachian tube, left ear: Secondary | ICD-10-CM | POA: Insufficient documentation

## 2024-08-10 DIAGNOSIS — H6123 Impacted cerumen, bilateral: Secondary | ICD-10-CM | POA: Insufficient documentation

## 2024-08-17 DIAGNOSIS — E7800 Pure hypercholesterolemia, unspecified: Secondary | ICD-10-CM | POA: Diagnosis not present

## 2024-09-04 DIAGNOSIS — G629 Polyneuropathy, unspecified: Secondary | ICD-10-CM | POA: Diagnosis not present

## 2024-09-04 DIAGNOSIS — I10 Essential (primary) hypertension: Secondary | ICD-10-CM | POA: Diagnosis not present

## 2024-09-04 DIAGNOSIS — E7800 Pure hypercholesterolemia, unspecified: Secondary | ICD-10-CM | POA: Diagnosis not present

## 2024-09-04 DIAGNOSIS — E1165 Type 2 diabetes mellitus with hyperglycemia: Secondary | ICD-10-CM | POA: Diagnosis not present

## 2024-09-04 DIAGNOSIS — I739 Peripheral vascular disease, unspecified: Secondary | ICD-10-CM | POA: Diagnosis not present

## 2024-09-15 ENCOUNTER — Ambulatory Visit (INDEPENDENT_AMBULATORY_CARE_PROVIDER_SITE_OTHER): Admitting: Audiology

## 2024-09-15 ENCOUNTER — Ambulatory Visit (INDEPENDENT_AMBULATORY_CARE_PROVIDER_SITE_OTHER): Admitting: Otolaryngology

## 2024-10-30 ENCOUNTER — Ambulatory Visit (INDEPENDENT_AMBULATORY_CARE_PROVIDER_SITE_OTHER): Admitting: Audiology

## 2024-10-30 ENCOUNTER — Ambulatory Visit (INDEPENDENT_AMBULATORY_CARE_PROVIDER_SITE_OTHER): Admitting: Otolaryngology
